# Patient Record
Sex: Female | Born: 1975 | Race: Black or African American | Hispanic: No | Smoking: Current every day smoker
Health system: Southern US, Community
[De-identification: ages and names within clinical notes are randomized; demographics above are authoritative.]

## PROBLEM LIST (undated history)

## (undated) DIAGNOSIS — J449 Chronic obstructive pulmonary disease, unspecified: Secondary | ICD-10-CM

## (undated) DIAGNOSIS — F419 Anxiety disorder, unspecified: Secondary | ICD-10-CM

## (undated) DIAGNOSIS — Z5189 Encounter for other specified aftercare: Secondary | ICD-10-CM

## (undated) DIAGNOSIS — F329 Major depressive disorder, single episode, unspecified: Secondary | ICD-10-CM

## (undated) DIAGNOSIS — D649 Anemia, unspecified: Secondary | ICD-10-CM

## (undated) DIAGNOSIS — F32A Depression, unspecified: Secondary | ICD-10-CM

## (undated) DIAGNOSIS — T7840XA Allergy, unspecified, initial encounter: Secondary | ICD-10-CM

## (undated) DIAGNOSIS — I2699 Other pulmonary embolism without acute cor pulmonale: Secondary | ICD-10-CM

## (undated) HISTORY — DX: Depression, unspecified: F32.A

## (undated) HISTORY — DX: Encounter for other specified aftercare: Z51.89

## (undated) HISTORY — DX: Major depressive disorder, single episode, unspecified: F32.9

## (undated) HISTORY — DX: Anxiety disorder, unspecified: F41.9

## (undated) HISTORY — PX: ABDOMINAL HYSTERECTOMY: SHX81

## (undated) HISTORY — PX: OTHER SURGICAL HISTORY: SHX169

## (undated) HISTORY — PX: HERNIA REPAIR: SHX51

## (undated) HISTORY — DX: Allergy, unspecified, initial encounter: T78.40XA

## (undated) HISTORY — DX: Anemia, unspecified: D64.9

## (undated) HISTORY — PX: TUBAL LIGATION: SHX77

---

## 2015-01-21 ENCOUNTER — Encounter (INDEPENDENT_AMBULATORY_CARE_PROVIDER_SITE_OTHER): Payer: Self-pay

## 2015-01-21 ENCOUNTER — Ambulatory Visit (INDEPENDENT_AMBULATORY_CARE_PROVIDER_SITE_OTHER): Payer: Medicaid Other | Admitting: Pediatrics

## 2015-01-21 ENCOUNTER — Encounter: Payer: Self-pay | Admitting: Pediatrics

## 2015-01-21 VITALS — BP 123/81 | HR 69 | Temp 97.1°F | Ht 66.0 in | Wt 119.4 lb

## 2015-01-21 DIAGNOSIS — F411 Generalized anxiety disorder: Secondary | ICD-10-CM

## 2015-01-21 MED ORDER — ALPRAZOLAM 0.25 MG PO TABS: 0.2500 mg | ORAL_TABLET | Freq: Two times a day (BID) | ORAL | Status: DC

## 2015-01-21 MED ORDER — BUSPIRONE HCL 7.5 MG PO TABS: 15.0000 mg | ORAL_TABLET | Freq: Two times a day (BID) | ORAL | Status: DC

## 2015-01-21 NOTE — Progress Notes (Signed)
Subjective:    Patient ID: Chloe Ortiz, female    DOB: 01/11/1976, 39 y.o.   MRN: [id number]  HPI: Chloe Ortiz is a 39 y.o. female presenting on 01/21/2015 for New Patient (Initial Visit)  Mom has been battling cancer for two years, Since Aug 4th mom's health has been going down hill, complications and multiple surgeries. She had a laryngectomy for throat cancer. Has started taking breaks from staying with mom this week.   Has been on meds for anxiety in the past. Has tried xanax in the past, has tried lorazepam. First started in her twenties. Last was a few years ago. Says she has been on multiple medicines in the past for depression such as paxil, zoloft, but doesn't think she has ever been depressed. Mood has been stable and fine, aside from family illness. No thoughts of hurting herself or anyone else.  Relevant past medical, surgical, family and social history reviewed and updated as indicated. Interim medical history since our last visit reviewed. Allergies and medications reviewed and updated.  Review of Systems  Constitutional: Negative.   HENT: Negative.   Respiratory: Negative.   Cardiovascular: Negative.   Gastrointestinal: Negative.   Genitourinary: Negative.   Musculoskeletal: Negative.   Skin: Negative.   Neurological: Negative.   Psychiatric/Behavioral: Negative for depression and suicidal ideas. The patient is nervous/anxious.     ROS: Per HPI unless specifically indicated above  GAD7 Feeling nervous, anxious or on edge 3  Not being able to stop or control worrying 3 Worrying too much about different things 3 Trouble relaxing 2 Being so restless that it is hard to sit still 0 Becoming easily annoyed or irritable 2 Feeling afraid as if something awful might happen 2        Total Score 15   Past Medical History There are no active problems to display for this patient.   Current Outpatient Prescriptions  Medication Sig Dispense Refill  . ALPRAZolam  (XANAX) 0.25 MG tablet Take 1 tablet (0.25 mg total) by mouth 2 (two) times daily. 30 tablet 0  . busPIRone (BUSPAR) 7.5 MG tablet Take 2 tablets (15 mg total) by mouth 2 (two) times daily. 60 tablet 2  . estradiol (ESTRACE) 1 MG tablet Take 0.5 mg by mouth daily.    . megestrol (MEGACE) 40 MG tablet   0   No current facility-administered medications for this visit.       Objective:    BP 123/81 mmHg  Pulse 69  Temp(Src) 97.1 F (36.2 C) (Oral)  Ht 5\' 6"  (1.676 m)  Wt 119 lb 6.4 oz (54.159 kg)  BMI 19.28 kg/m2  Wt Readings from Last 3 Encounters:  01/21/15 119 lb 6.4 oz (54.159 kg)     Gen: NAD, alert, thin, cooperative with exam, NCAT EYES: EOMI, no scleral injection or icterus ENT:  OP without erythema LYMPH: no cervical LAD NECK: normal thyroid, no nodules CV: NRRR, normal S1/S2, no murmur, DP pulses 2+ b/l Resp: CTABL, no wheezes, normal WOB Abd: +BS, soft, NTND. no guarding or organomegaly Ext: No edema, warm Neuro: Alert and oriented, strength equal b/l UE and LE, coodrination grossly normal MSK: normal muscle bulk     Assessment & Plan:   Chloe Ortiz was seen today for new patient (initial visit). She meets criteria for gen anxiety d/o based on GAD7, she has had symptoms since her twenties. Recently exacerbated by family illness and stressors. Xanax for acute stress over next month. If still having trouble with  sleep at next visit will try something else.  Diagnoses and all orders for this visit:  Generalized anxiety disorder -     busPIRone (BUSPAR) 7.5 MG tablet; Take 2 tablets (15 mg total) by mouth 2 (two) times daily. -     ALPRAZolam (XANAX) 0.25 MG tablet; Take 1 tablet (0.25 mg total) by mouth 2 (two) times daily.   Follow up plan: Return in about 4 weeks (around 02/18/2015).  Chloe Kras, MD [facility] 01/21/2015, 3:18 PM

## 2015-01-21 NOTE — Patient Instructions (Addendum)
One tab in the morning, one tab at night.  Can increase after 4-5 days to 1.5 tabs in morning.  Then after a few days 1.5 tabs in the morning and at night.  Max increase to 2 tabs in the morning, 2 tabs at night.

## 2015-02-02 ENCOUNTER — Telehealth: Payer: Self-pay | Admitting: Pediatrics

## 2015-02-02 NOTE — Telephone Encounter (Signed)
Patient aware that Dr. Oswaldo Done will not increase or change amount.

## 2015-02-18 ENCOUNTER — Ambulatory Visit: Payer: Medicaid Other | Admitting: Pediatrics

## 2015-02-19 ENCOUNTER — Encounter: Payer: Self-pay | Admitting: Pediatrics

## 2015-04-27 ENCOUNTER — Ambulatory Visit: Payer: Medicaid Other

## 2019-06-23 ENCOUNTER — Institutional Professional Consult (permissible substitution): Payer: Self-pay | Admitting: Pulmonary Disease

## 2019-07-23 ENCOUNTER — Other Ambulatory Visit: Payer: Self-pay

## 2019-07-23 ENCOUNTER — Encounter: Payer: Self-pay | Admitting: Pulmonary Disease

## 2019-07-23 ENCOUNTER — Ambulatory Visit (INDEPENDENT_AMBULATORY_CARE_PROVIDER_SITE_OTHER): Payer: Medicaid Other | Admitting: Pulmonary Disease

## 2019-07-23 VITALS — BP 100/70 | HR 66 | Temp 98.4°F | Ht 65.5 in | Wt 120.2 lb

## 2019-07-23 DIAGNOSIS — Z79890 Hormone replacement therapy (postmenopausal): Secondary | ICD-10-CM

## 2019-07-23 DIAGNOSIS — J4540 Moderate persistent asthma, uncomplicated: Secondary | ICD-10-CM | POA: Diagnosis not present

## 2019-07-23 DIAGNOSIS — R0602 Shortness of breath: Secondary | ICD-10-CM

## 2019-07-23 DIAGNOSIS — F17200 Nicotine dependence, unspecified, uncomplicated: Secondary | ICD-10-CM

## 2019-07-23 DIAGNOSIS — I2699 Other pulmonary embolism without acute cor pulmonale: Secondary | ICD-10-CM | POA: Diagnosis not present

## 2019-07-23 MED ORDER — TRELEGY ELLIPTA 200-62.5-25 MCG/INH IN AEPB: 1.0000 | INHALATION_SPRAY | Freq: Every day | RESPIRATORY_TRACT | 0 refills | Status: AC

## 2019-07-23 MED ORDER — ALBUTEROL SULFATE (2.5 MG/3ML) 0.083% IN NEBU: 2.5000 mg | INHALATION_SOLUTION | Freq: Four times a day (QID) | RESPIRATORY_TRACT | 11 refills | Status: DC | PRN

## 2019-07-23 NOTE — Addendum Note (Signed)
Addended by: Jacquiline Doe on: 07/23/2019 02:42 PM   Modules accepted: Orders

## 2019-07-23 NOTE — Progress Notes (Signed)
Synopsis: Referred in March 2021 for asthma/copd by Kizzie Furnish D., PA-C  Subjective:   PATIENT ID: Chloe Ortiz GENDER: female DOB: 1976/01/23, MRN: [id number]  Chief Complaint  Patient presents with  . Consult    Patient was at [facility] and scans showed blood clots. Patient has shortness of breath with exertion. Patient was started on Eliquis twice a day. Patient has been diagnosed with asthma and COPD.     44 year old female past medical history of allergies, asthma since childhood longstanding history of smoking since teenager.  Quit in January 2021 at the time of her recent ER visit.  Patient went to the emergency room at [facility] care with complaints of shortness of breath.  Patient was evaluated for Covid.  Covid was negative she underwent CT imaging of the chest after having a positive D-dimer of greater than 2.  She was found to have bilateral PE.  Patient was started on Eliquis.  Patient's medication regimen includes Megace given to her for an appetite stimulant as well as estradiol.  She is also at the time a smoker.  She is not up-to-date with her mammogram screening and she is above the age of 41.  She did have a hysterectomy in the past.  She states that she did have abnormal Pap smears in the past but has not followed up with women health in several years.  Overall today she complains of still fatigue and shortness of breath.  Denies hemoptysis.      Past Medical History:  Diagnosis Date  . Allergy   . Anemia   . Anxiety   . Blood transfusion without reported diagnosis   . Depression      Family History  Problem Relation Age of Onset  . Cancer Mother   . Alcohol abuse Mother   . Arthritis Mother   . Diabetes Mother   . Drug abuse Mother   . Hypertension Mother   . Miscarriages / [place] Mother   . Varicose Veins Mother   . Alcohol abuse Father   . Asthma Father   . Depression Father   . Diabetes Father   . Drug abuse Father    . Hyperlipidemia Father   . Hypertension Father   . Mental illness Father   . Varicose Veins Father   . Diabetes Sister   . Hypertension Sister   . Varicose Veins Sister      Past Surgical History:  Procedure Laterality Date  . ABDOMINAL HYSTERECTOMY    . HERNIA REPAIR    . Lypoma removal    . TUBAL LIGATION      Social History   Socioeconomic History  . Marital status: Divorced    Spouse name: Not on file  . Number of children: Not on file  . Years of education: Not on file  . Highest education level: Not on file  Occupational History  . Not on file  Tobacco Use  . Smoking status: Current Some Day Smoker    Packs/day: 0.25    Years: 10.00    Pack years: 2.50    Types: Cigars, Cigarettes  . Smokeless tobacco: Never Used  Substance and Sexual Activity  . Alcohol use: No  . Drug use: Yes    Types: Marijuana  . Sexual activity: Never  Other Topics Concern  . Not on file  Social History Narrative  . Not on file   Social Determinants of Health   Financial Resource Strain:   . Difficulty  of Paying Living Expenses: Not on file  Food Insecurity:   . Worried About Programme researcher, broadcasting/film/video in the Last Year: Not on file  . Ran Out of Food in the Last Year: Not on file  Transportation Needs:   . Lack of Transportation (Medical): Not on file  . Lack of Transportation (Non-Medical): Not on file  Physical Activity:   . Days of Exercise per Week: Not on file  . Minutes of Exercise per Session: Not on file  Stress:   . Feeling of Stress : Not on file  Social Connections:   . Frequency of Communication with Friends and Family: Not on file  . Frequency of Social Gatherings with Friends and Family: Not on file  . Attends Religious Services: Not on file  . Active Member of Clubs or Organizations: Not on file  . Attends Banker Meetings: Not on file  . Marital Status: Not on file  Intimate Partner Violence:   . Fear of Current or Ex-Partner: Not on file  .  Emotionally Abused: Not on file  . Physically Abused: Not on file  . Sexually Abused: Not on file     Not on File   Outpatient Medications Prior to Visit  Medication Sig Dispense Refill  . albuterol (VENTOLIN HFA) 108 (90 Base) MCG/ACT inhaler Inhale 1 puff into the lungs as needed.    Marland Kitchen apixaban (ELIQUIS) 5 MG TABS tablet Take 5 mg by mouth 2 (two) times daily.    . megestrol (MEGACE) 40 MG tablet   0  . ALPRAZolam (XANAX) 0.25 MG tablet Take 1 tablet (0.25 mg total) by mouth 2 (two) times daily. (Patient not taking: Reported on 07/23/2019) 30 tablet 0  . busPIRone (BUSPAR) 7.5 MG tablet Take 2 tablets (15 mg total) by mouth 2 (two) times daily. (Patient not taking: Reported on 07/23/2019) 60 tablet 2  . estradiol (ESTRACE) 1 MG tablet Take 0.5 mg by mouth daily.     No facility-administered medications prior to visit.    Review of Systems  Constitutional: Negative for chills, fever, malaise/fatigue and weight loss.  HENT: Negative for hearing loss, sore throat and tinnitus.   Eyes: Negative for blurred vision and double vision.  Respiratory: Positive for shortness of breath. Negative for cough, hemoptysis, sputum production, wheezing and stridor.   Cardiovascular: Negative for chest pain, palpitations, orthopnea, leg swelling and PND.  Gastrointestinal: Negative for abdominal pain, constipation, diarrhea, heartburn, nausea and vomiting.  Genitourinary: Negative for dysuria, hematuria and urgency.  Musculoskeletal: Negative for joint pain and myalgias.  Skin: Negative for itching and rash.  Neurological: Negative for dizziness, tingling, weakness and headaches.  Endo/Heme/Allergies: Negative for environmental allergies. Does not bruise/bleed easily.  Psychiatric/Behavioral: Negative for depression. The patient is not nervous/anxious and does not have insomnia.   All other systems reviewed and are negative.    Objective:  Physical Exam Vitals reviewed.  Constitutional:       General: She is not in acute distress.    Appearance: She is well-developed.     Comments: Thin    HENT:     Head: Normocephalic and atraumatic.  Eyes:     General: No scleral icterus.    Conjunctiva/sclera: Conjunctivae normal.     Pupils: Pupils are equal, round, and reactive to light.  Neck:     Vascular: No JVD.     Trachea: No tracheal deviation.  Cardiovascular:     Rate and Rhythm: Normal rate and regular rhythm.  Heart sounds: Normal heart sounds. No murmur.  Pulmonary:     Effort: Pulmonary effort is normal. No tachypnea, accessory muscle usage or respiratory distress.     Breath sounds: Normal breath sounds. No stridor. No wheezing, rhonchi or rales.  Abdominal:     General: Bowel sounds are normal. There is no distension.     Palpations: Abdomen is soft.     Tenderness: There is no abdominal tenderness.  Musculoskeletal:        General: No tenderness.     Cervical back: Neck supple.     Comments: Low muscle mass  Lymphadenopathy:     Cervical: No cervical adenopathy.  Skin:    General: Skin is warm and dry.     Capillary Refill: Capillary refill takes less than 2 seconds.     Findings: No rash.  Neurological:     Mental Status: She is alert and oriented to person, place, and time.  Psychiatric:        Behavior: Behavior normal.      Vitals:   07/23/19 1351  BP: 100/70  Pulse: 66  Temp: 98.4 F (36.9 C)  TempSrc: Temporal  SpO2: 99%  Weight: 120 lb 3.2 oz (54.5 kg)  Height: 5' 5.5" (1.664 m)   99% on RA BMI Readings from Last 3 Encounters:  07/23/19 19.70 kg/m  01/21/15 19.27 kg/m   Wt Readings from Last 3 Encounters:  07/23/19 120 lb 3.2 oz (54.5 kg)  01/21/15 119 lb 6.4 oz (54.2 kg)     CBC No results found for: WBC, RBC, HGB, HCT, PLT, MCV, MCH, MCHC, RDW, LYMPHSABS, MONOABS, EOSABS, BASOSABS   Chest Imaging: I was able to review report but not images themself.  Bilateral PE from CT chest 05/23/2019 [facility].  Pulmonary  Functions Testing Results: No flowsheet data found.  FeNO: None   Pathology: None   Echocardiogram: None   Heart Catheterization: None     Assessment & Plan:     ICD-10-CM   1. SOB (shortness of breath)  R06.02 Pulmonary Function Test  2. Bilateral pulmonary embolism (HCC)  I26.99   3. Moderate persistent asthma without complication  J45.40   4. Smoker  F17.200   5. Hormone replacement therapy  Z79.890     Discussion: This is a young 44 year old female likely with provoked pulmonary embolism in the setting of Megace use, estradiol use and smoking.  I believe this will continue to take some time for recovery of her shortness of breath.  However her asthma symptoms are not controlled.  She does have nocturnal wheezing.  Thankfully she has been able to quit smoking.  She has at least smoked 1 or 2 cigarettes since January but she does plan to completely quit.  Plan: DME supplies for albuterol nebulizer Samples of Trelegy 200 New albuterol inhalers prescription. Stop Megace use Stop estradiol use Follow-up with women's health for age-appropriate cancer screening Likely needs mammogram, has had hysterectomy question need of cervical cancer screening continued. Continue Eliquis at this time.  Follow-up in 3 months with pulmonary function test.  All have been ordered. Patient instructed on how to use new inhaler.  Greater than 50% of this patient's 45-minute of visit was spent face-to-face discussing the above recommendations and treatment plan.     Current Outpatient Medications:  .  albuterol (VENTOLIN HFA) 108 (90 Base) MCG/ACT inhaler, Inhale 1 puff into the lungs as needed., Disp: , Rfl:  .  apixaban (ELIQUIS) 5 MG TABS tablet, Take 5  mg by mouth 2 (two) times daily., Disp: , Rfl:  .  megestrol (MEGACE) 40 MG tablet, , Disp: , Rfl: 0 .  ALPRAZolam (XANAX) 0.25 MG tablet, Take 1 tablet (0.25 mg total) by mouth 2 (two) times daily. (Patient not taking: Reported on 07/23/2019),  Disp: 30 tablet, Rfl: 0 .  busPIRone (BUSPAR) 7.5 MG tablet, Take 2 tablets (15 mg total) by mouth 2 (two) times daily. (Patient not taking: Reported on 07/23/2019), Disp: 60 tablet, Rfl: 2 .  estradiol (ESTRACE) 1 MG tablet, Take 0.5 mg by mouth daily., Disp: , Rfl:    Garner Nash, DO [facility] 07/23/2019 2:02 PM

## 2019-07-23 NOTE — Patient Instructions (Addendum)
Thank you for visiting Dr. Tonia Brooms at [facility]. Today we recommend the following:  Orders Placed This Encounter  Procedures  . Pulmonary Function Test   DME supply for new nebulizer machine + albuterol solution  New rx for albuterol inhaler Samples of Trelegy 200 Stop megace and estradiol  Stop smoking Continue eliquis until we get this info back and 3 months off offending agents  Follow up womens health, ?repeat PAP needs also need mammogram   Return in about 3 months (around 10/23/2019). w/ Dr. Tonia Brooms or APP     Please do your part to reduce the spread of COVID-19.

## 2019-07-29 ENCOUNTER — Telehealth: Payer: Self-pay | Admitting: Pulmonary Disease

## 2019-07-29 NOTE — Telephone Encounter (Signed)
Order for nebulizer was placed 3/3 after pt's OV with Dr. Tonia Brooms.  [facility], can you help [place] out with this please as pt is checking on status of the machine and supplies.

## 2019-07-30 NOTE — Telephone Encounter (Signed)
[facility]ed [facility] they had pt set up to get machine but she never came and got it I gave them her # and asked them to call I will also call the [name]

## 2019-10-17 ENCOUNTER — Other Ambulatory Visit (HOSPITAL_COMMUNITY)
Admission: RE | Admit: 2019-10-17 | Discharge: 2019-10-17 | Disposition: A | Discharge status: Home / Self Care | Payer: Medicaid Other | Source: Ambulatory Visit | Attending: Pulmonary Disease | Admitting: Pulmonary Disease

## 2019-10-21 ENCOUNTER — Other Ambulatory Visit (HOSPITAL_COMMUNITY)
Admission: RE | Admit: 2019-10-21 | Discharge: 2019-10-21 | Disposition: A | Discharge status: Home / Self Care | Payer: Medicaid Other | Source: Ambulatory Visit | Attending: Pulmonary Disease | Admitting: Pulmonary Disease

## 2019-10-21 NOTE — Progress Notes (Signed)
Patient missed COVID test Fri. 10/17/2019. (I forgot to make a note then.)  I rescheded her test for this morning pt didn't show. Calling Almyra Free to ask her to call patient to reschedule COVID test and Procedure. Spoke with Chantel, she said she would call the patient to set up COVID test and procedure again. Nothing further needed.

## 2019-10-23 ENCOUNTER — Ambulatory Visit: Payer: Medicaid Other | Admitting: Pulmonary Disease

## 2019-10-23 ENCOUNTER — Ambulatory Visit: Payer: Medicaid Other | Admitting: Primary Care

## 2020-01-16 ENCOUNTER — Emergency Department (HOSPITAL_COMMUNITY)
Admission: EM | Admit: 2020-01-16 | Discharge: 2020-01-16 | Disposition: A | Discharge status: Home / Self Care | Payer: Medicaid Other | Attending: Emergency Medicine | Admitting: Emergency Medicine

## 2020-01-16 ENCOUNTER — Encounter (HOSPITAL_COMMUNITY): Payer: Self-pay

## 2020-01-16 ENCOUNTER — Other Ambulatory Visit: Payer: Self-pay

## 2020-01-16 ENCOUNTER — Emergency Department (HOSPITAL_COMMUNITY): Payer: Medicaid Other

## 2020-01-16 DIAGNOSIS — M545 Low back pain: Secondary | ICD-10-CM | POA: Insufficient documentation

## 2020-01-16 DIAGNOSIS — Z86711 Personal history of pulmonary embolism: Secondary | ICD-10-CM | POA: Insufficient documentation

## 2020-01-16 DIAGNOSIS — R0789 Other chest pain: Secondary | ICD-10-CM | POA: Insufficient documentation

## 2020-01-16 DIAGNOSIS — Z5321 Procedure and treatment not carried out due to patient leaving prior to being seen by health care provider: Secondary | ICD-10-CM | POA: Diagnosis not present

## 2020-01-16 HISTORY — DX: Other pulmonary embolism without acute cor pulmonale: I26.99

## 2020-01-16 LAB — TROPONIN I (HIGH SENSITIVITY)
Troponin I (High Sensitivity): 2 ng/L (ref <18)
Troponin I (High Sensitivity): 3 ng/L (ref <18)

## 2020-01-16 LAB — CBC
HCT: 43.3 % (ref 36.0–46.0)
Hemoglobin: 14.8 g/dL (ref 12.0–15.0)
MCH: 31.1 pg (ref 26.0–34.0)
MCHC: 34.2 g/dL (ref 30.0–36.0)
MCV: 91 fL (ref 80.0–100.0)
Platelets: 264 10*3/uL (ref 150–400)
RBC: 4.76 MIL/uL (ref 3.87–5.11)
RDW: 12.9 % (ref 11.5–15.5)
WBC: 7.6 10*3/uL (ref 4.0–10.5)
nRBC: 0 % (ref 0.0–0.2)

## 2020-01-16 LAB — BASIC METABOLIC PANEL
Anion gap: 15 (ref 5–15)
BUN: 15 mg/dL (ref 6–20)
CO2: 20 mmol/L — ABNORMAL LOW (ref 22–32)
Calcium: 9.6 mg/dL (ref 8.9–10.3)
Chloride: 100 mmol/L (ref 98–111)
Creatinine, Ser: 0.69 mg/dL (ref 0.44–1.00)
GFR calc Af Amer: >60 mL/min (ref >60)
GFR calc non Af Amer: >60 mL/min (ref >60)
Glucose, Bld: 72 mg/dL (ref 70–99)
Potassium: 3.6 mmol/L (ref 3.5–5.1)
Sodium: 135 mmol/L (ref 135–145)

## 2020-01-16 LAB — D-DIMER, QUANTITATIVE: D-Dimer, Quant: <0.27 ug/mL-FEU (ref 0.00–0.50)

## 2020-01-16 NOTE — ED Triage Notes (Signed)
Pt reports that she has pain left chest and right lower back. Not eating well and vomiting. Pt has a hx of PE back in [place]

## 2020-03-14 ENCOUNTER — Emergency Department (HOSPITAL_COMMUNITY)
Admission: EM | Admit: 2020-03-14 | Discharge: 2020-03-14 | Disposition: A | Discharge status: Home / Self Care | Payer: Medicaid Other | Attending: Emergency Medicine | Admitting: Emergency Medicine

## 2020-03-14 ENCOUNTER — Emergency Department (HOSPITAL_COMMUNITY): Payer: Medicaid Other

## 2020-03-14 ENCOUNTER — Encounter (HOSPITAL_COMMUNITY): Payer: Self-pay | Admitting: *Deleted

## 2020-03-14 DIAGNOSIS — J449 Chronic obstructive pulmonary disease, unspecified: Secondary | ICD-10-CM | POA: Diagnosis not present

## 2020-03-14 DIAGNOSIS — J069 Acute upper respiratory infection, unspecified: Secondary | ICD-10-CM | POA: Insufficient documentation

## 2020-03-14 DIAGNOSIS — F17210 Nicotine dependence, cigarettes, uncomplicated: Secondary | ICD-10-CM | POA: Diagnosis not present

## 2020-03-14 DIAGNOSIS — Z20822 Contact with and (suspected) exposure to covid-19: Secondary | ICD-10-CM | POA: Diagnosis not present

## 2020-03-14 DIAGNOSIS — R0602 Shortness of breath: Secondary | ICD-10-CM

## 2020-03-14 DIAGNOSIS — R042 Hemoptysis: Secondary | ICD-10-CM | POA: Diagnosis present

## 2020-03-14 HISTORY — DX: Chronic obstructive pulmonary disease, unspecified: J44.9

## 2020-03-14 LAB — TROPONIN I (HIGH SENSITIVITY): Troponin I (High Sensitivity): <2 ng/L (ref <18)

## 2020-03-14 LAB — BASIC METABOLIC PANEL
Anion gap: 8 (ref 5–15)
BUN: 11 mg/dL (ref 6–20)
CO2: 25 mmol/L (ref 22–32)
Calcium: 9 mg/dL (ref 8.9–10.3)
Chloride: 105 mmol/L (ref 98–111)
Creatinine, Ser: 0.58 mg/dL (ref 0.44–1.00)
GFR, Estimated: >60 mL/min (ref >60)
Glucose, Bld: 91 mg/dL (ref 70–99)
Potassium: 3.9 mmol/L (ref 3.5–5.1)
Sodium: 138 mmol/L (ref 135–145)

## 2020-03-14 LAB — CBC WITH DIFFERENTIAL/PLATELET
Abs Immature Granulocytes: 0.03 10*3/uL (ref 0.00–0.07)
Basophils Absolute: 0 10*3/uL (ref 0.0–0.1)
Basophils Relative: 0 %
Eosinophils Absolute: 0.2 10*3/uL (ref 0.0–0.5)
Eosinophils Relative: 2 %
HCT: 38.8 % (ref 36.0–46.0)
Hemoglobin: 12.8 g/dL (ref 12.0–15.0)
Immature Granulocytes: 0 %
Lymphocytes Relative: 19 %
Lymphs Abs: 1.9 10*3/uL (ref 0.7–4.0)
MCH: 30.3 pg (ref 26.0–34.0)
MCHC: 33 g/dL (ref 30.0–36.0)
MCV: 91.9 fL (ref 80.0–100.0)
Monocytes Absolute: 0.5 10*3/uL (ref 0.1–1.0)
Monocytes Relative: 5 %
Neutro Abs: 7.1 10*3/uL (ref 1.7–7.7)
Neutrophils Relative %: 74 %
Platelets: 234 10*3/uL (ref 150–400)
RBC: 4.22 MIL/uL (ref 3.87–5.11)
RDW: 13.2 % (ref 11.5–15.5)
WBC: 9.7 10*3/uL (ref 4.0–10.5)
nRBC: 0 % (ref 0.0–0.2)

## 2020-03-14 LAB — RESPIRATORY PANEL BY RT PCR (FLU A&B, COVID)
Influenza A by PCR: NEGATIVE
Influenza B by PCR: NEGATIVE
SARS Coronavirus 2 by RT PCR: NEGATIVE

## 2020-03-14 MED ORDER — BENZONATATE 100 MG PO CAPS: 200.0000 mg | ORAL_CAPSULE | Freq: Three times a day (TID) | ORAL | 0 refills | Status: DC | PRN

## 2020-03-14 MED ORDER — ALBUTEROL SULFATE (2.5 MG/3ML) 0.083% IN NEBU: 5.0000 mg | INHALATION_SOLUTION | Freq: Once | RESPIRATORY_TRACT | Status: DC

## 2020-03-14 MED ORDER — IOHEXOL 350 MG/ML SOLN
75.0000 mL | Freq: Once | INTRAVENOUS | Status: AC | PRN
  Administered 2020-03-14: 75 mL via INTRAVENOUS

## 2020-03-14 NOTE — ED Triage Notes (Signed)
Cough, vomiting, diminished smell and taste

## 2020-03-14 NOTE — ED Provider Notes (Signed)
[facility] EMERGENCY DEPARTMENT Provider Note   CSN: [id number] Arrival date & time: 03/14/20  1035     History Chief Complaint  Patient presents with  . Hemoptysis    Chloe Ortiz is a 44 y.o. female with a history significant for anemia, COPD, history of pulmonary embolism diagnosed at [facility] who completed a 6 month course of xaralto 2 months ago, presenting with a 1 day history of cough with hemoptysis along with nasal congestion, reduced smell and taste, and reports woke today with a nose bleed which resolved spontaneously.  She describes coughing up blood tinged sputum along with what appears to be a solid blood clot (has picture on phone) this morning.  Of note, this occurred after she had the nose bleed.  She does report chills and flushes but no documented fevers. She denies chest pain, n/v, abdominal pain and denies lower extremity pain or swelling.  She has not been covid vaccinated, states they initially though she had Covid when admitted at [facility] when she was diagnosed with the PE, tested negative for Covid.   The history is provided by the patient.       Past Medical History:  Diagnosis Date  . Allergy   . Anemia   . Anxiety   . Blood transfusion without reported diagnosis   . COPD (chronic obstructive pulmonary disease) (HCC)   . Depression   . Pulmonary embolism (HCC)     There are no problems to display for this patient.   Past Surgical History:  Procedure Laterality Date  . ABDOMINAL HYSTERECTOMY    . HERNIA REPAIR    . Lypoma removal    . TUBAL LIGATION       OB History   No obstetric history on file.     Family History  Problem Relation Age of Onset  . Cancer Mother   . Alcohol abuse Mother   . Arthritis Mother   . Diabetes Mother   . Drug abuse Mother   . Hypertension Mother   . Miscarriages / [place] Mother   . Varicose Veins Mother   . Alcohol abuse Father   . Asthma Father   . Depression Father   . Diabetes Father   .  Drug abuse Father   . Hyperlipidemia Father   . Hypertension Father   . Mental illness Father   . Varicose Veins Father   . Diabetes Sister   . Hypertension Sister   . Varicose Veins Sister     Social History   Tobacco Use  . Smoking status: Current Some Day Smoker    Packs/day: 0.25    Years: 10.00    Pack years: 2.50    Types: Cigars, Cigarettes  . Smokeless tobacco: Never Used  Vaping Use  . Vaping Use: Never used  Substance Use Topics  . Alcohol use: No  . Drug use: Yes    Types: Marijuana    Home Medications Prior to Admission medications   Medication Sig Start Date End Date Taking? Authorizing Provider  albuterol (PROVENTIL) (2.5 MG/3ML) 0.083% nebulizer solution Take 3 mLs (2.5 mg total) by nebulization every 6 (six) hours as needed for wheezing or shortness of breath. 07/23/19   Icard, Rachel Bo, DO  albuterol (VENTOLIN HFA) 108 (90 Base) MCG/ACT inhaler Inhale 1 puff into the lungs as needed. 11/04/18 11/04/19  [provider]  ALPRAZolam Prudy Feeler) 0.25 MG tablet Take 1 tablet (0.25 mg total) by mouth 2 (two) times daily. Patient not taking: Reported on  07/23/2019 01/21/15   Johna Sheriff, MD  apixaban (ELIQUIS) 5 MG TABS tablet Take 5 mg by mouth 2 (two) times daily.    [provider]  benzonatate (TESSALON) 100 MG capsule Take 2 capsules (200 mg total) by mouth 3 (three) times daily as needed. 03/14/20   Burgess Amor, PA-C  busPIRone (BUSPAR) 7.5 MG tablet Take 2 tablets (15 mg total) by mouth 2 (two) times daily. Patient not taking: Reported on 07/23/2019 01/21/15   Johna Sheriff, MD  estradiol (ESTRACE) 1 MG tablet Take 0.5 mg by mouth daily.    [provider]  Fluticasone-Umeclidin-Vilant (TRELEGY ELLIPTA) 200-62.5-25 MCG/INH AEPB Inhale 1 puff into the lungs daily. 07/23/19   Josephine Igo, DO  megestrol (MEGACE) 40 MG tablet  01/19/15   [provider]    Allergies    Patient has no known allergies.  Review of Systems    Review of Systems  Constitutional: Positive for chills and diaphoresis. Negative for fever.  HENT: Positive for congestion and nosebleeds. Negative for sore throat.   Eyes: Negative.   Respiratory: Positive for cough and shortness of breath. Negative for chest tightness and wheezing.   Cardiovascular: Negative for chest pain, palpitations and leg swelling.  Gastrointestinal: Negative for abdominal pain, nausea and vomiting.  Genitourinary: Negative.   Musculoskeletal: Negative for arthralgias, joint swelling and neck pain.  Skin: Negative.  Negative for rash and wound.  Neurological: Negative for dizziness, weakness, light-headedness, numbness and headaches.  Psychiatric/Behavioral: Negative.   All other systems reviewed and are negative.   Physical Exam Updated Vital Signs BP (!) 139/91 (BP Location: Right Arm)   Pulse 60   Temp 98 F (36.7 C) (Oral)   Resp 16   SpO2 100%   Physical Exam Constitutional:      Appearance: She is well-developed.  HENT:     Head: Normocephalic and atraumatic.     Right Ear: Tympanic membrane and ear canal normal.     Left Ear: Tympanic membrane and ear canal normal.     Nose: Mucosal edema and congestion present. No rhinorrhea.     Comments: No active bleeding or source of bleed seen.    Mouth/Throat:     Mouth: Mucous membranes are moist.     Pharynx: Uvula midline. No oropharyngeal exudate or posterior oropharyngeal erythema.     Tonsils: No tonsillar abscesses.  Eyes:     Conjunctiva/sclera: Conjunctivae normal.  Cardiovascular:     Rate and Rhythm: Normal rate.     Heart sounds: Normal heart sounds.  Pulmonary:     Effort: Pulmonary effort is normal. No respiratory distress.     Breath sounds: No wheezing, rhonchi or rales.  Abdominal:     Palpations: Abdomen is soft.     Tenderness: There is no abdominal tenderness. There is no guarding or rebound.  Musculoskeletal:        General: No tenderness. Normal range of motion.      Cervical back: Normal range of motion.     Right lower leg: No edema.     Left lower leg: No edema.  Skin:    General: Skin is warm and dry.     Findings: No rash.  Neurological:     Mental Status: She is alert and oriented to person, place, and time.     ED Results / Procedures / Treatments   Labs (all labs ordered are listed, but only abnormal results are displayed) Labs Reviewed  RESPIRATORY PANEL BY  RT PCR (FLU A&B, COVID)  CBC WITH DIFFERENTIAL/PLATELET  BASIC METABOLIC PANEL  TROPONIN I (HIGH SENSITIVITY)    EKG None  Radiology CT Angio Chest PE W and/or Wo Contrast  Result Date: 03/14/2020 CLINICAL DATA:  Left chest pain, cough x3 days, history of PE, high probability PE EXAM: CT ANGIOGRAPHY CHEST WITH CONTRAST TECHNIQUE: Multidetector CT imaging of the chest was performed using the standard protocol during bolus administration of intravenous contrast. Multiplanar CT image reconstructions and MIPs were obtained to evaluate the vascular anatomy. CONTRAST:  29mL OMNIPAQUE IOHEXOL 350 MG/ML SOLN COMPARISON:  10/06/2019 FINDINGS: Cardiovascular: Heart size upper limits normal. No pericardial effusion. Some reflux of contrast from the right atrium into hepatic veins. Satisfactory opacification of pulmonary arteries noted, and there is no evidence of pulmonary emboli. Fair contrast opacification of the thoracic aorta. No suggestion of aneurysm, dissection, or stenosis. 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 Mediastinum/Nodes: No mass or adenopathy. Lungs/Pleura: No pleural effusion. No pneumothorax. Small subpleural blebs in the apices. Lungs otherwise clear. Upper Abdomen: No acute findings. Musculoskeletal: No chest wall abnormality. No acute or significant osseous findings. Review of the MIP images confirms the above findings. IMPRESSION: Negative for acute PE or thoracic aortic dissection. Electronically Signed   By: Corlis Leak M.D.   On:  03/14/2020 14:31   DG Chest Port 1 View  Result Date: 03/14/2020 CLINICAL DATA:  Nausea and vomiting EXAM: PORTABLE CHEST 1 VIEW COMPARISON:  01/16/2020 FINDINGS: The heart size and mediastinal contours are within normal limits. Mildly hyperinflated lungs. Both lungs are clear. The visualized skeletal structures are unremarkable. IMPRESSION: No acute cardiopulmonary process. Electronically Signed   By: Duanne Guess D.O.   On: 03/14/2020 12:48    Procedures Procedures (including critical care time)  Medications Ordered in ED Medications  iohexol (OMNIPAQUE) 350 MG/ML injection 75 mL (75 mLs Intravenous Contrast Given 03/14/20 1405)    ED Course  I have reviewed the triage vital signs and the nursing notes.  Pertinent labs & imaging results that were available during my care of the patient were reviewed by me and considered in my medical decision making (see chart for details).    MDM Rules/Calculators/A&P                          Labs reviewed, CT imaging also reviewed and discussed with patient. He was given reassurance that she does not have a pulmonary embolism and also no sign of pneumonia. At reexam she was comfortable with her breathing, no wheezing, no shortness of breath and her vital signs were stable. She does continue to have nasal congestion without bleeding. Suspect she has a viral URI. Discussed home treatment for symptom relief, she was prescribed Tessalon for cough suppression. I suspect the hemoptysis was actually a sputum from postnasal drip from her earlier nosebleed. Advised as needed follow-up for any persistent or worsening symptoms. Her Covid test is negative today. Final Clinical Impression(s) / ED Diagnoses Final diagnoses:  Viral URI with cough    Rx / DC Orders ED Discharge Orders         Ordered    benzonatate (TESSALON) 100 MG capsule  3 times daily PRN        03/14/20 1558           Victoriano Lain 03/14/20 1921    Eber Hong,  MD 03/15/20 [phone]

## 2020-03-14 NOTE — Discharge Instructions (Signed)
Your cough may benefit from taking the Tessalon which has been prescribed to you.  I also encourage you to take Mucinex and make sure you are drinking plenty of water.  Get rest, you may also take Tylenol or Motrin for body aches.  You were CT imaging today is negative for pneumonia and pulmonary embolism.  Also your Covid and flu tests are negative.  Get rechecked for any persistent or worsening symptoms.

## 2020-06-02 DIAGNOSIS — Z0289 Encounter for other administrative examinations: Secondary | ICD-10-CM

## 2020-06-11 ENCOUNTER — Encounter (HOSPITAL_COMMUNITY): Payer: Self-pay | Admitting: Emergency Medicine

## 2020-06-11 ENCOUNTER — Other Ambulatory Visit: Payer: Self-pay

## 2020-06-11 ENCOUNTER — Emergency Department (HOSPITAL_COMMUNITY)
Admission: EM | Admit: 2020-06-11 | Discharge: 2020-06-11 | Disposition: A | Discharge status: Home / Self Care | Payer: Medicaid Other | Attending: Emergency Medicine | Admitting: Emergency Medicine

## 2020-06-11 DIAGNOSIS — Z20822 Contact with and (suspected) exposure to covid-19: Secondary | ICD-10-CM

## 2020-06-11 DIAGNOSIS — Z7951 Long term (current) use of inhaled steroids: Secondary | ICD-10-CM | POA: Insufficient documentation

## 2020-06-11 DIAGNOSIS — Z7901 Long term (current) use of anticoagulants: Secondary | ICD-10-CM | POA: Insufficient documentation

## 2020-06-11 DIAGNOSIS — F17290 Nicotine dependence, other tobacco product, uncomplicated: Secondary | ICD-10-CM | POA: Diagnosis not present

## 2020-06-11 DIAGNOSIS — U071 COVID-19: Secondary | ICD-10-CM | POA: Diagnosis not present

## 2020-06-11 DIAGNOSIS — J449 Chronic obstructive pulmonary disease, unspecified: Secondary | ICD-10-CM | POA: Diagnosis not present

## 2020-06-11 DIAGNOSIS — J111 Influenza due to unidentified influenza virus with other respiratory manifestations: Secondary | ICD-10-CM

## 2020-06-11 DIAGNOSIS — R519 Headache, unspecified: Secondary | ICD-10-CM | POA: Diagnosis present

## 2020-06-11 LAB — SARS CORONAVIRUS 2 (TAT 6-24 HRS): SARS Coronavirus 2: POSITIVE — AB

## 2020-06-11 MED ORDER — ONDANSETRON 4 MG PO TBDP
4.0000 mg | ORAL_TABLET | Freq: Once | ORAL | Status: AC
  Administered 2020-06-11: 4 mg via ORAL
  Filled 2020-06-11: qty 1

## 2020-06-11 MED ORDER — ONDANSETRON 4 MG PO TBDP: 4.0000 mg | ORAL_TABLET | Freq: Three times a day (TID) | ORAL | 0 refills | Status: DC | PRN

## 2020-06-11 MED ORDER — METHYLPREDNISOLONE 4 MG PO TBPK: ORAL_TABLET | ORAL | 0 refills | Status: DC

## 2020-06-11 NOTE — ED Triage Notes (Signed)
Pt was exposed to Covid by her children on Friday of last week.  Pt c/o a HA, body aches, fever, with N/V that began Wednesday.

## 2020-06-11 NOTE — ED Provider Notes (Signed)
[facility] EMERGENCY DEPARTMENT Provider Note   CSN: [id number] Arrival date & time: 06/11/20  1130     History Chief Complaint  Patient presents with  . Headache    Chloe Ortiz is a 45 y.o. female presents emergency department with chief complaint of flulike symptoms.  She had onset of her symptoms 3 days ago.  She has known COVID-19 virus exposure at home as both her children were infected last week.  She has symptoms of headache, body aches, chills, painful cough, generalized weakness, fatigue, nausea and intermittent episodes of vomiting.  She denies diarrhea.  She has been able to eat and drink.  She has a history of smoking but has not been able to smoke since she got sick 3 days ago.  She has had one of the vaccines against coronavirus but is not fully vaccinated.  She denies hemoptysis or severe shortness of breath.  HPI     Past Medical History:  Diagnosis Date  . Allergy   . Anemia   . Anxiety   . Blood transfusion without reported diagnosis   . COPD (chronic obstructive pulmonary disease) (HCC)   . Depression   . Pulmonary embolism (HCC)     There are no problems to display for this patient.   Past Surgical History:  Procedure Laterality Date  . ABDOMINAL HYSTERECTOMY    . HERNIA REPAIR    . Lypoma removal    . TUBAL LIGATION       OB History   No obstetric history on file.     Family History  Problem Relation Age of Onset  . Cancer Mother   . Alcohol abuse Mother   . Arthritis Mother   . Diabetes Mother   . Drug abuse Mother   . Hypertension Mother   . Miscarriages / [place] Mother   . Varicose Veins Mother   . Alcohol abuse Father   . Asthma Father   . Depression Father   . Diabetes Father   . Drug abuse Father   . Hyperlipidemia Father   . Hypertension Father   . Mental illness Father   . Varicose Veins Father   . Diabetes Sister   . Hypertension Sister   . Varicose Veins Sister     Social History   Tobacco Use  .  Smoking status: Current Some Day Smoker    Packs/day: 0.25    Years: 10.00    Pack years: 2.50    Types: Cigars  . Smokeless tobacco: Never Used  Vaping Use  . Vaping Use: Never used  Substance Use Topics  . Alcohol use: No  . Drug use: Yes    Frequency: 7.0 times per week    Types: Marijuana    Home Medications Prior to Admission medications   Medication Sig Start Date End Date Taking? Authorizing Provider  albuterol (PROVENTIL) (2.5 MG/3ML) 0.083% nebulizer solution Take 3 mLs (2.5 mg total) by nebulization every 6 (six) hours as needed for wheezing or shortness of breath. 07/23/19   Icard, Rachel Bo, DO  albuterol (VENTOLIN HFA) 108 (90 Base) MCG/ACT inhaler Inhale 1 puff into the lungs as needed. 11/04/18 11/04/19  [provider]  ALPRAZolam Prudy Feeler) 0.25 MG tablet Take 1 tablet (0.25 mg total) by mouth 2 (two) times daily. Patient not taking: Reported on 07/23/2019 01/21/15   Johna Sheriff, MD  apixaban (ELIQUIS) 5 MG TABS tablet Take 5 mg by mouth 2 (two) times daily.    [provider]  benzonatate (  TESSALON) 100 MG capsule Take 2 capsules (200 mg total) by mouth 3 (three) times daily as needed. 03/14/20   Burgess Amor, PA-C  busPIRone (BUSPAR) 7.5 MG tablet Take 2 tablets (15 mg total) by mouth 2 (two) times daily. Patient not taking: Reported on 07/23/2019 01/21/15   Johna Sheriff, MD  estradiol (ESTRACE) 1 MG tablet Take 0.5 mg by mouth daily.    [provider]  Fluticasone-Umeclidin-Vilant (TRELEGY ELLIPTA) 200-62.5-25 MCG/INH AEPB Inhale 1 puff into the lungs daily. 07/23/19   Josephine Igo, DO  megestrol (MEGACE) 40 MG tablet  01/19/15   [provider]    Allergies    Patient has no known allergies.  Review of Systems   Review of Systems Ten systems reviewed and are negative for acute change, except as noted in the HPI.  Physical Exam Updated Vital Signs BP 112/82 (BP Location: Right Arm)   Pulse 87   Temp 98.5 F (36.9 C) (Oral)    Resp 16   Ht 5\' 6"  (1.676 m)   Wt 54.9 kg   SpO2 98%   BMI 19.53 kg/m   Physical Exam Vitals and nursing note reviewed.  Constitutional:      General: She is not in acute distress.    Appearance: She is well-developed and well-nourished. She is ill-appearing. She is not toxic-appearing or diaphoretic.  HENT:     Head: Normocephalic and atraumatic.  Eyes:     General: No scleral icterus.    Conjunctiva/sclera: Conjunctivae normal.  Cardiovascular:     Rate and Rhythm: Normal rate and regular rhythm.     Heart sounds: Normal heart sounds. No murmur heard. No friction rub. No gallop.   Pulmonary:     Effort: Pulmonary effort is normal. No respiratory distress.     Breath sounds: Normal breath sounds.  Abdominal:     General: Bowel sounds are normal. There is no distension.     Palpations: Abdomen is soft. There is no mass.     Tenderness: There is no abdominal tenderness. There is no guarding.  Musculoskeletal:     Cervical back: Normal range of motion.  Skin:    General: Skin is warm and dry.  Neurological:     Mental Status: She is alert and oriented to person, place, and time.  Psychiatric:        Behavior: Behavior normal.     ED Results / Procedures / Treatments   Labs (all labs ordered are listed, but only abnormal results are displayed) Labs Reviewed - No data to display  EKG None  Radiology No results found.  Procedures Procedures (including critical care time)  Medications Ordered in ED Medications - No data to display  ED Course  I have reviewed the triage vital signs and the nursing notes.  Pertinent labs & imaging results that were available during my care of the patient were reviewed by me and considered in my medical decision making (see chart for details).    MDM Rules/Calculators/A&P                          Patient here with influenza-like illness.  She is afebrile with normal vital signs and hemodynamically stable.  Patient given  Zofran here in the emergency department.  Will discharge with a steroid taper.  Discussed over-the-counter treatment for her symptoms and return precautions as well as home isolation.   was evaluated in Emergency Department on 06/11/2020  for the symptoms described in the history of present illness. She was evaluated in the context of the global COVID-19 pandemic, which necessitated consideration that the patient might be at risk for infection with the SARS-CoV-2 virus that causes COVID-19. Institutional protocols and algorithms that pertain to the evaluation of patients at risk for COVID-19 are in a state of rapid change based on information released by regulatory bodies including the CDC and federal and state organizations. These policies and algorithms were followed during the patient's care in the ED.  Final Clinical Impression(s) / ED Diagnoses Final diagnoses:  None    Rx / DC Orders ED Discharge Orders    None       Arthor Captain, PA-C 06/11/20 1328    Horton, Clabe Seal, DO 06/11/20 1604

## 2020-06-11 NOTE — Discharge Instructions (Addendum)
Person Under Monitoring Name: Chloe Ortiz  Location: [address] [place] [address]   Infection Prevention Recommendations for Individuals Confirmed to have, or Being Evaluated for, 2019 Novel Coronavirus (COVID-19) Infection Who Receive Care at Home  Individuals who are confirmed to have, or are being evaluated for, COVID-19 should follow the prevention steps below until a healthcare provider or local or state health department says they can return to normal activities.  Stay home except to get medical care You should restrict activities outside your home, except for getting medical care. Do not go to work, school, or public areas, and do not use public transportation or taxis.  Call ahead before visiting your doctor Before your medical appointment, call the healthcare provider and tell them that you have, or are being evaluated for, COVID-19 infection. This will help the healthcare providers office take steps to keep other people from getting infected. Ask your healthcare provider to call the local or state health department.  Monitor your symptoms Seek prompt medical attention if your illness is worsening (e.g., difficulty breathing). Before going to your medical appointment, call the healthcare provider and tell them that you have, or are being evaluated for, COVID-19 infection. Ask your healthcare provider to call the local or state health department.  Wear a facemask You should wear a facemask that covers your nose and mouth when you are in the same room with other people and when you visit a healthcare provider. People who live with or visit you should also wear a facemask while they are in the same room with you.  Separate yourself from other people in your home As much as possible, you should stay in a different room from other people in your home. Also, you should use a separate bathroom, if available.  Avoid sharing household items You should not  share dishes, drinking glasses, cups, eating utensils, towels, bedding, or other items with other people in your home. After using these items, you should wash them thoroughly with soap and water.  Cover your coughs and sneezes Cover your mouth and nose with a tissue when you cough or sneeze, or you can cough or sneeze into your sleeve. Throw used tissues in a lined trash can, and immediately wash your hands with soap and water for at least 20 seconds or use an alcohol-based hand rub.  Wash your [employer] your hands often and thoroughly with soap and water for at least 20 seconds. You can use an alcohol-based hand sanitizer if soap and water are not available and if your hands are not visibly dirty. Avoid touching your eyes, nose, and mouth with unwashed hands.   Prevention Steps for Caregivers and Household Members of Individuals Confirmed to have, or Being Evaluated for, COVID-19 Infection Being Cared for in the Home  If you live with, or provide care at home for, a person confirmed to have, or being evaluated for, COVID-19 infection please follow these guidelines to prevent infection:  Follow healthcare providers instructions Make sure that you understand and can help the patient follow any healthcare provider instructions for all care.  Provide for the patients basic needs You should help the patient with basic needs in the home and provide support for getting groceries, prescriptions, and other personal needs.  Monitor the patients symptoms If they are getting sicker, call his or her medical provider and tell them that the patient has, or is being evaluated for, COVID-19 infection. This will help the healthcare providers office  take steps to keep other people from getting infected. Ask the healthcare provider to call the local or state health department.  Limit the number of people who have contact with the patient If possible, have only one caregiver for the  patient. Other household members should stay in another home or place of residence. If this is not possible, they should stay in another room, or be separated from the patient as much as possible. Use a separate bathroom, if available. Restrict visitors who do not have an essential need to be in the home.  Keep older adults, very young children, and other sick people away from the patient Keep older adults, very young children, and those who have compromised immune systems or chronic health conditions away from the patient. This includes people with chronic heart, lung, or kidney conditions, diabetes, and cancer.  Ensure good ventilation Make sure that shared spaces in the home have good air flow, such as from an air conditioner or an opened window, weather permitting.  Wash your hands often Wash your hands often and thoroughly with soap and water for at least 20 seconds. You can use an alcohol based hand sanitizer if soap and water are not available and if your hands are not visibly dirty. Avoid touching your eyes, nose, and mouth with unwashed hands. Use disposable paper towels to dry your hands. If not available, use dedicated cloth towels and replace them when they become wet.  Wear a facemask and gloves Wear a disposable facemask at all times in the room and gloves when you touch or have contact with the patients blood, body fluids, and/or secretions or excretions, such as sweat, saliva, sputum, nasal mucus, vomit, urine, or feces.  Ensure the mask fits over your nose and mouth tightly, and do not touch it during use. Throw out disposable facemasks and gloves after using them. Do not reuse. Wash your hands immediately after removing your facemask and gloves. If your personal clothing becomes contaminated, carefully remove clothing and launder. Wash your hands after handling contaminated clothing. Place all used disposable facemasks, gloves, and other waste in a lined container before  disposing them with other household waste. Remove gloves and wash your hands immediately after handling these items.  Do not share dishes, glasses, or other household items with the patient Avoid sharing household items. You should not share dishes, drinking glasses, cups, eating utensils, towels, bedding, or other items with a patient who is confirmed to have, or being evaluated for, COVID-19 infection. After the person uses these items, you should wash them thoroughly with soap and water.  Wash laundry thoroughly Immediately remove and wash clothes or bedding that have blood, body fluids, and/or secretions or excretions, such as sweat, saliva, sputum, nasal mucus, vomit, urine, or feces, on them. Wear gloves when handling laundry from the patient. Read and follow directions on labels of laundry or clothing items and detergent. In general, wash and dry with the warmest temperatures recommended on the label.  Clean all areas the individual has used often Clean all touchable surfaces, such as counters, tabletops, doorknobs, bathroom fixtures, toilets, phones, keyboards, tablets, and bedside tables, every day. Also, clean any surfaces that may have blood, body fluids, and/or secretions or excretions on them. Wear gloves when cleaning surfaces the patient has come in contact with. Use a diluted bleach solution (e.g., dilute bleach with 1 part bleach and 10 parts water) or a household disinfectant with a label that says EPA-registered for coronaviruses. To make a bleach  solution at home, add 1 tablespoon of bleach to 1 quart (4 cups) of water. For a larger supply, add  cup of bleach to 1 gallon (16 cups) of water. Read labels of cleaning products and follow recommendations provided on product labels. Labels contain instructions for safe and effective use of the cleaning product including precautions you should take when applying the product, such as wearing gloves or eye protection and making sure you  have good ventilation during use of the product. Remove gloves and wash hands immediately after cleaning.  Monitor yourself for signs and symptoms of illness Caregivers and household members are considered close contacts, should monitor their health, and will be asked to limit movement outside of the home to the extent possible. Follow the monitoring steps for close contacts listed on the symptom monitoring form.   ? If you have additional questions, contact your local health department or call the epidemiologist on call at [phone] (available 24/7). ? This guidance is subject to change. For the most up-to-date guidance from [facility], please refer to their website: [email or web address]

## 2020-06-15 ENCOUNTER — Emergency Department (HOSPITAL_COMMUNITY)
Admission: EM | Admit: 2020-06-15 | Discharge: 2020-06-15 | Discharge status: Against Medical Advice | Payer: Medicaid Other

## 2020-06-15 ENCOUNTER — Other Ambulatory Visit: Payer: Self-pay

## 2020-06-25 ENCOUNTER — Emergency Department (HOSPITAL_COMMUNITY)
Admission: EM | Admit: 2020-06-25 | Discharge: 2020-06-25 | Disposition: A | Discharge status: Home / Self Care | Payer: Medicaid Other | Attending: Emergency Medicine | Admitting: Emergency Medicine

## 2020-06-25 ENCOUNTER — Other Ambulatory Visit: Payer: Self-pay

## 2020-06-25 ENCOUNTER — Emergency Department (HOSPITAL_COMMUNITY): Payer: Medicaid Other

## 2020-06-25 ENCOUNTER — Encounter (HOSPITAL_COMMUNITY): Payer: Self-pay

## 2020-06-25 DIAGNOSIS — Z86711 Personal history of pulmonary embolism: Secondary | ICD-10-CM | POA: Insufficient documentation

## 2020-06-25 DIAGNOSIS — R079 Chest pain, unspecified: Secondary | ICD-10-CM

## 2020-06-25 DIAGNOSIS — J449 Chronic obstructive pulmonary disease, unspecified: Secondary | ICD-10-CM | POA: Insufficient documentation

## 2020-06-25 DIAGNOSIS — Z7951 Long term (current) use of inhaled steroids: Secondary | ICD-10-CM | POA: Diagnosis not present

## 2020-06-25 DIAGNOSIS — F17290 Nicotine dependence, other tobacco product, uncomplicated: Secondary | ICD-10-CM | POA: Diagnosis not present

## 2020-06-25 DIAGNOSIS — U071 COVID-19: Secondary | ICD-10-CM | POA: Diagnosis not present

## 2020-06-25 DIAGNOSIS — R071 Chest pain on breathing: Secondary | ICD-10-CM

## 2020-06-25 DIAGNOSIS — R059 Cough, unspecified: Secondary | ICD-10-CM | POA: Diagnosis present

## 2020-06-25 DIAGNOSIS — R0789 Other chest pain: Secondary | ICD-10-CM

## 2020-06-25 LAB — CBC
HCT: 37.8 % (ref 36.0–46.0)
Hemoglobin: 12.7 g/dL (ref 12.0–15.0)
MCH: 30.6 pg (ref 26.0–34.0)
MCHC: 33.6 g/dL (ref 30.0–36.0)
MCV: 91.1 fL (ref 80.0–100.0)
Platelets: 274 10*3/uL (ref 150–400)
RBC: 4.15 MIL/uL (ref 3.87–5.11)
RDW: 13 % (ref 11.5–15.5)
WBC: 7.7 10*3/uL (ref 4.0–10.5)
nRBC: 0 % (ref 0.0–0.2)

## 2020-06-25 LAB — BASIC METABOLIC PANEL
Anion gap: 8 (ref 5–15)
BUN: 14 mg/dL (ref 6–20)
CO2: 25 mmol/L (ref 22–32)
Calcium: 9.6 mg/dL (ref 8.9–10.3)
Chloride: 104 mmol/L (ref 98–111)
Creatinine, Ser: 0.64 mg/dL (ref 0.44–1.00)
GFR, Estimated: >60 mL/min (ref >60)
Glucose, Bld: 87 mg/dL (ref 70–99)
Potassium: 3.8 mmol/L (ref 3.5–5.1)
Sodium: 137 mmol/L (ref 135–145)

## 2020-06-25 LAB — D-DIMER, QUANTITATIVE: D-Dimer, Quant: 0.3 ug/mL-FEU (ref 0.00–0.50)

## 2020-06-25 LAB — TROPONIN I (HIGH SENSITIVITY): Troponin I (High Sensitivity): <2 ng/L (ref <18)

## 2020-06-25 MED ORDER — ASPIRIN 325 MG PO TABS
325.0000 mg | ORAL_TABLET | Freq: Once | ORAL | Status: AC
  Administered 2020-06-25: 325 mg via ORAL
  Filled 2020-06-25: qty 1

## 2020-06-25 MED ORDER — ALBUTEROL SULFATE HFA 108 (90 BASE) MCG/ACT IN AERS
2.0000 | INHALATION_SPRAY | Freq: Once | RESPIRATORY_TRACT | Status: AC
  Administered 2020-06-25: 2 via RESPIRATORY_TRACT
  Filled 2020-06-25: qty 6.7

## 2020-06-25 NOTE — ED Triage Notes (Signed)
Pt here via [facility] from health dept where she c/o chest pain x 2 months, hx of PE, CBG 103 with EMS. Reproducible with palpation and inspiration. Pressure pain that radiates in a sharp pain to left arm.

## 2020-06-25 NOTE — ED Provider Notes (Signed)
[facility] EMERGENCY DEPARTMENT Provider Note   CSN: [id number] Arrival date & time: 06/25/20  1237     History Chief Complaint  Patient presents with  . Chest Pain    Chloe Ortiz is a 45 y.o. female.  HPI   Patient with significant medical history of anemia, COPD, PE diagnosed at [facility] completed 6 months course of Xarelto, presents with chief complaint of chest pain x2 months.  Patient endorses she has had chest pain since she stopped her Xarelto, states it is intermediate, she has sharp and dull pains, sometimes associated with shortness of breath, does not radiate, states it stays in the substernal and left side of her chest.  She has no cardiac history, no history of AAA's, Marfan's disease, denies hormone use, no recent leg swelling or recent travels.  She endorses that the pain is reproducible when she moves around a lot or someone presses her chest, denies exertion making the pain worse.  She does endorse that she was recently Covid positive on the 27th, continues to have nasal congestion and a cough, does not think the cough makes the chest pain worse.  She is unsure whether food makes the pain worse.  She has no GI history.  Patient denies alleviating factors.  Patient denies headaches, fevers, chills, abdominal pain, nausea, vomiting, diarrhea, worsening pedal edema.  Past Medical History:  Diagnosis Date  . Allergy   . Anemia   . Anxiety   . Blood transfusion without reported diagnosis   . COPD (chronic obstructive pulmonary disease) (HCC)   . Depression   . Pulmonary embolism (HCC)     There are no problems to display for this patient.   Past Surgical History:  Procedure Laterality Date  . ABDOMINAL HYSTERECTOMY    . HERNIA REPAIR    . Lypoma removal    . TUBAL LIGATION       OB History   No obstetric history on file.     Family History  Problem Relation Age of Onset  . Cancer Mother   . Alcohol abuse Mother   . Arthritis Mother   .  Diabetes Mother   . Drug abuse Mother   . Hypertension Mother   . Miscarriages / [place] Mother   . Varicose Veins Mother   . Alcohol abuse Father   . Asthma Father   . Depression Father   . Diabetes Father   . Drug abuse Father   . Hyperlipidemia Father   . Hypertension Father   . Mental illness Father   . Varicose Veins Father   . Diabetes Sister   . Hypertension Sister   . Varicose Veins Sister     Social History   Tobacco Use  . Smoking status: Current Some Day Smoker    Packs/day: 0.25    Years: 10.00    Pack years: 2.50    Types: Cigars  . Smokeless tobacco: Never Used  Vaping Use  . Vaping Use: Never used  Substance Use Topics  . Alcohol use: No  . Drug use: Yes    Frequency: 7.0 times per week    Types: Marijuana    Home Medications Prior to Admission medications   Medication Sig Start Date End Date Taking? Authorizing Provider  albuterol (PROVENTIL) (2.5 MG/3ML) 0.083% nebulizer solution Take 3 mLs (2.5 mg total) by nebulization every 6 (six) hours as needed for wheezing or shortness of breath. 07/23/19  Yes Icard, Bradley L, DO  albuterol (VENTOLIN HFA) 108 (90 Base)  MCG/ACT inhaler Inhale 1 puff into the lungs as needed for wheezing or shortness of breath. 11/04/18 06/25/20 Yes [provider]  ALPRAZolam Prudy Feeler) 0.25 MG tablet Take 1 tablet (0.25 mg total) by mouth 2 (two) times daily. 01/21/15  Yes Johna Sheriff, MD  estradiol (ESTRACE) 1 MG tablet Take 0.5 mg by mouth daily.   Yes [provider]  Fluticasone-Umeclidin-Vilant (TRELEGY ELLIPTA) 200-62.5-25 MCG/INH AEPB Inhale 1 puff into the lungs daily. 07/23/19  Yes Icard, Bradley L, DO  Ibuprofen 200 MG CAPS Take 200 mg by mouth daily as needed (pain).   Yes [provider]  raNITIdine HCl (ZANTAC PO) Take 1 tablet by mouth daily as needed (heartburn).   Yes [provider]  Simethicone (GAS-X PO) Take 1 tablet by mouth daily as needed (for gas).   Yes [provider]  benzonatate (TESSALON) 100 MG capsule Take 2 capsules (200 mg total) by mouth 3 (three) times daily as needed. Patient not taking: No sig reported 03/14/20   Burgess Amor, PA-C  busPIRone (BUSPAR) 7.5 MG tablet Take 2 tablets (15 mg total) by mouth 2 (two) times daily. Patient not taking: No sig reported 01/21/15   Johna Sheriff, MD  methylPREDNISolone (MEDROL DOSEPAK) 4 MG TBPK tablet Use as directed Patient not taking: No sig reported 06/11/20   Arthor Captain, PA-C  ondansetron (ZOFRAN ODT) 4 MG disintegrating tablet Take 1 tablet (4 mg total) by mouth every 8 (eight) hours as needed for nausea or vomiting. Patient not taking: No sig reported 06/11/20   Arthor Captain, PA-C    Allergies    Patient has no known allergies.  Review of Systems   Review of Systems  Constitutional: Negative for chills and fever.  HENT: Positive for congestion. Negative for sore throat.   Eyes: Negative for visual disturbance.  Respiratory: Positive for cough and shortness of breath.   Cardiovascular: Positive for chest pain. Negative for palpitations and leg swelling.  Gastrointestinal: Negative for abdominal pain, diarrhea, nausea and vomiting.  Genitourinary: Negative for dysuria and enuresis.  Musculoskeletal: Negative for back pain.  Skin: Negative for rash.  Neurological: Negative for dizziness and headaches.  Hematological: Does not bruise/bleed easily.    Physical Exam Updated Vital Signs BP (!) 134/95   Pulse (!) 58   Temp 98.3 F (36.8 C)   Resp 16   Ht 5\' 5"  (1.651 m)   Wt 52.6 kg   SpO2 100%   BMI 19.30 kg/m   Physical Exam Vitals and nursing note reviewed.  Constitutional:      General: She is not in acute distress.    Appearance: She is not ill-appearing.  HENT:     Head: Normocephalic and atraumatic.     Nose: No congestion.  Eyes:     Conjunctiva/sclera: Conjunctivae normal.  Cardiovascular:     Rate and Rhythm: Normal rate and regular rhythm.      Pulses: Normal pulses.     Heart sounds: No murmur heard. No friction rub. No gallop.   Pulmonary:     Effort: No respiratory distress.     Breath sounds: No wheezing, rhonchi or rales.  Abdominal:     Palpations: Abdomen is soft.     Tenderness: There is no abdominal tenderness.  Musculoskeletal:     Right lower leg: No edema.     Left lower leg: No edema.     Comments: Patient's chest wall was palpated, it was is tender to palpation along patient's third  and fourth rib midclavicular line, states it feels like the pain she was feeling.  She also states moving her left arm makes the pain worse.  No flail chest noted, no deformities or crepitus noted on exam.  Patient is moving all 4 extremities at difficulty.  Skin:    General: Skin is warm and dry.  Neurological:     Mental Status: She is alert.  Psychiatric:        Mood and Affect: Mood normal.     ED Results / Procedures / Treatments   Labs (all labs ordered are listed, but only abnormal results are displayed) Labs Reviewed  BASIC METABOLIC PANEL  CBC  D-DIMER, QUANTITATIVE (NOT AT [facility])  TROPONIN I (HIGH SENSITIVITY)    EKG EKG Interpretation  Date/Time:  Friday June 25 2020 12:44:16 EST Ventricular Rate:  54 PR Interval:    QRS Duration: 73 QT Interval:  423 QTC Calculation: 401 R Axis:   48 Text Interpretation: Sinus rhythm RSR' in V1 or V2, probably normal variant No STEMI Confirmed by Alvester Chou [phone]) on 06/25/2020 2:22:02 PM   Radiology DG Chest 1 View  Result Date: 06/25/2020 CLINICAL DATA:  Chest pain EXAM: CHEST  1 VIEW COMPARISON:  May 26, 2020 FINDINGS: The cardiomediastinal silhouette is unchanged in contour. No pleural effusion. No pneumothorax. No acute pleuroparenchymal abnormality. Visualized abdomen is unremarkable. Levocurvature of the superior thoracic spine and dextrocurvature of the inferior thoracic spine. IMPRESSION: No acute cardiopulmonary abnormality. Electronically Signed    By: Meda Klinefelter MD   On: 06/25/2020 13:23    Procedures Procedures   Medications Ordered in ED Medications  aspirin tablet 325 mg (325 mg Oral Given 06/25/20 1340)  albuterol (VENTOLIN HFA) 108 (90 Base) MCG/ACT inhaler 2 puff (2 puffs Inhalation Given 06/25/20 1340)    ED Course  I have reviewed the triage vital signs and the nursing notes.  Pertinent labs & imaging results that were available during my care of the patient were reviewed by me and considered in my medical decision making (see chart for details).    MDM Rules/Calculators/A&P                          Initial impression-patient presents with chest pain x2 months.  She is alert, does not appear in acute distress, vital signs reassuring.  Concern for PE, AAA, ACS, pneumonia.  Will obtain basic lab work-up, chest x-ray, EKG, add on a D-dimer due to patient's PE history.  Will provide her with albuterol and reevaluate.  Work-up-CBC within normal limits.  BMP within normal limits.  D-dimer 0.3.  Mr. Troponin less than 2. Chest x-ray does not reveal any acute findings.  EKG sinus bradycardia without signs of ischemia no ST elevation depression noted.  Reassessment patient was reassessed after providing her with albuterol, she states she feels much better, states the chest pressure and pain has slightly improved.  She has no complaints at this time.  Vital signs have remained stable.  Patient ambulated without difficulty, no complaints of chest pain or shortness of breath, O2 sat stayed in the upper 90s.  Rule out- I have low suspicion for ACS as history is atypical, patient has no cardiac history, EKG is without signs of ischemia, patient patient had a initial troponin of less than 2.  Will defer second troponin as she has had chest pains greater than 12 hours with initial troponin less than 2 making ACS unlikely as I would  expect increasing troponin at this time. low Suspicion for PE as D-dimer is 0.3, no pedal edema noted my  exam, vital signs reassuring, O2 sats remain in the upper 90s with ambulation.  Low suspicion for AAA or aortic dissection as history is atypical, patient has low risk factors.  Low suspicion for systemic infection as patient is nontoxic-appearing, vital signs reassuring, no obvious source infection noted on exam.  Low suspicion for pneumonia as chest x-ray is negative, lung sounds are clear bilaterally.   Plan-suspect patient suffering from possible post COVID bronchitis as patient endorsed chest pain and improved with albuterol but differential diagnosis includes muscular strain, costochondritis, anxiety, GERD.  Will recommend she continue with her current recommendations and follow-up with her cardiologist and/or PCP for further evaluation.  Vital signs have remained stable, no indication for hospital admission.  Patient discussed with attending and they agreed with assessment and plan.  Patient given at home care as well strict return precautions.  Patient verbalized that they understood agreed to said plan.   Final Clinical Impression(s) / ED Diagnoses Final diagnoses:  Atypical chest pain    Rx / DC Orders ED Discharge Orders    None       Carroll Sage, PA-C 06/25/20 1448    Terald Sleeper, MD 06/25/20 [phone]

## 2020-06-25 NOTE — ED Notes (Signed)
Pt 100% on RA and maintained O2 sat of 100% on RA during ambulation.

## 2020-06-25 NOTE — Discharge Instructions (Signed)
Seen here for chest pain.  Lab work and imaging looks reassuring.  I recommend continuing with your medications as prescribed.  You may take over-the-counter pain medications like ibuprofen and or Tylenol every 6 hour as needed please follow dosage on the back of bottle.  Recommend you follow-up with a cardiologist for further evaluation.  Given the contact above please call.  Come back to the emergency department if you develop chest pain, shortness of breath, severe abdominal pain, uncontrolled nausea, vomiting, diarrhea.

## 2020-07-01 ENCOUNTER — Ambulatory Visit: Payer: Medicaid Other | Admitting: Pulmonary Disease

## 2020-07-06 ENCOUNTER — Ambulatory Visit: Payer: Medicaid Other | Admitting: Adult Health

## 2020-11-22 IMAGING — CT CT ANGIO CHEST
2 of 6 series · 19 of 46 positions shown · IV contrast (Omnipaque or Isovue)
Comparison: 10/06/2019

CLINICAL DATA: Left chest pain, cough x3 days, history of PE, high
probability PE

EXAM:
CT ANGIOGRAPHY CHEST WITH CONTRAST
TECHNIQUE: Multidetector CT imaging of the chest was performed using the
standard protocol during bolus administration of intravenous
contrast. Multiplanar CT image reconstructions and MIPs were
obtained to evaluate the vascular anatomy.
CONTRAST:  75mL OMNIPAQUE IOHEXOL 350 MG/ML SOLN

[Series 5: pe axial thins · axial · 0.64mm/px · z∈[+1140,+1400]mm · 16 of 286 slices shown]
[im 13/286  lung]
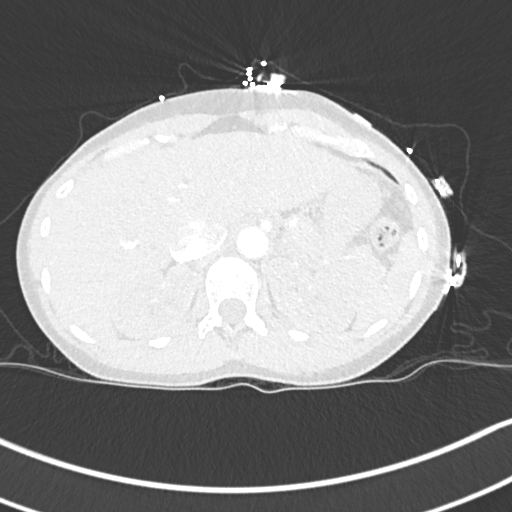
[im 38/286  soft-tissue]
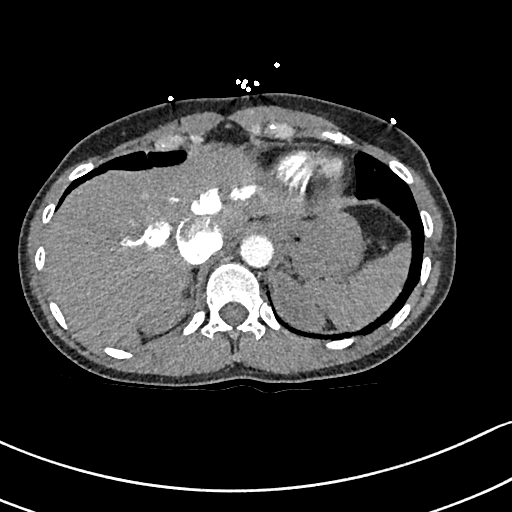
[im 50/286  lung]
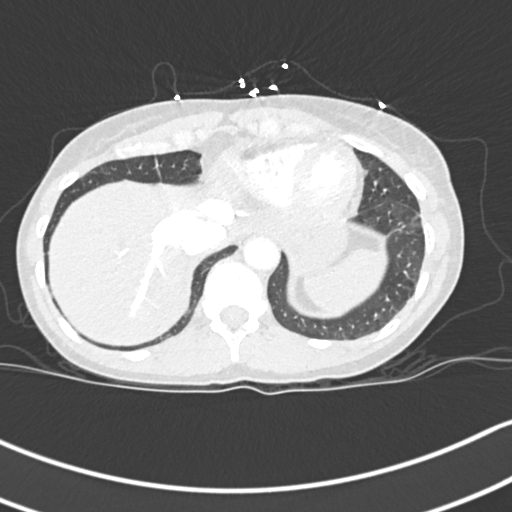
[im 62/286  soft-tissue]
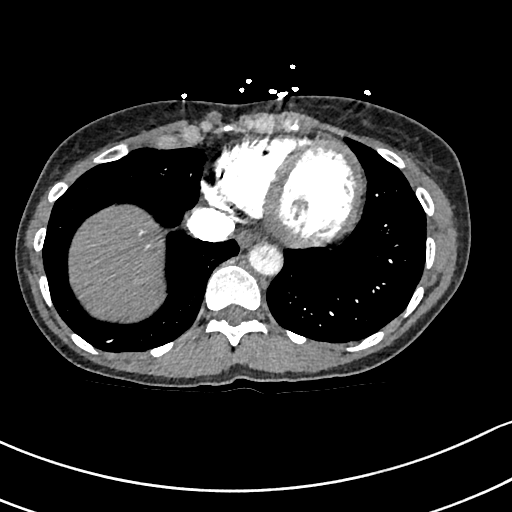
[im 87/286  lung]
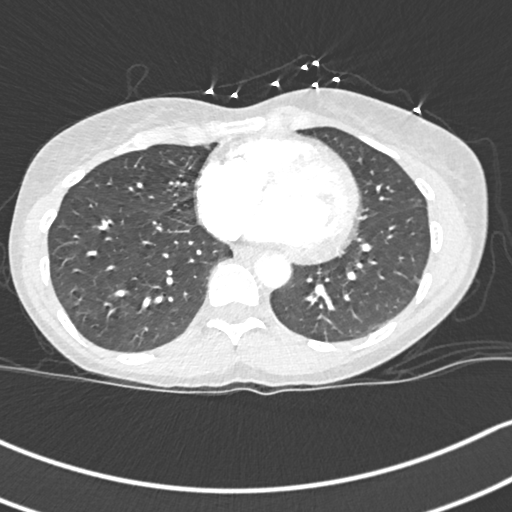
[im 100/286  soft-tissue]
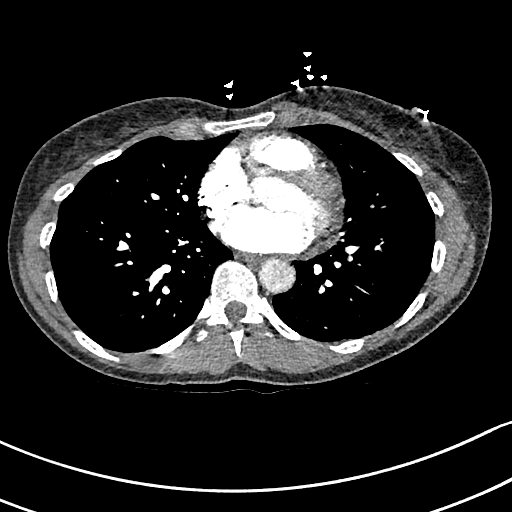
[im 112/286  lung]
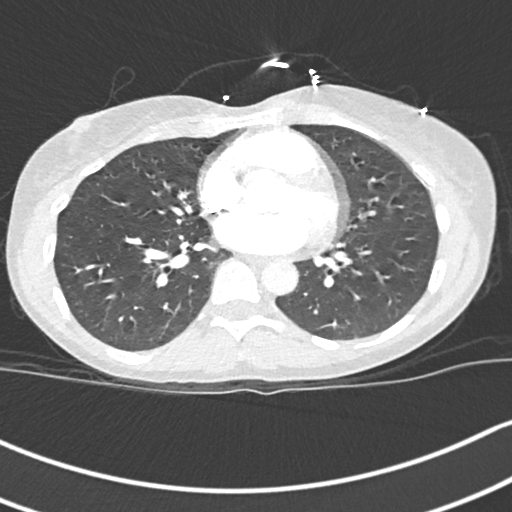
[im 137/286  soft-tissue]
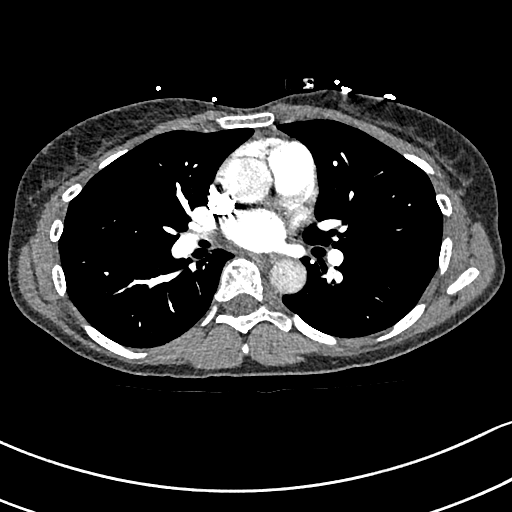
[im 149/286  lung]
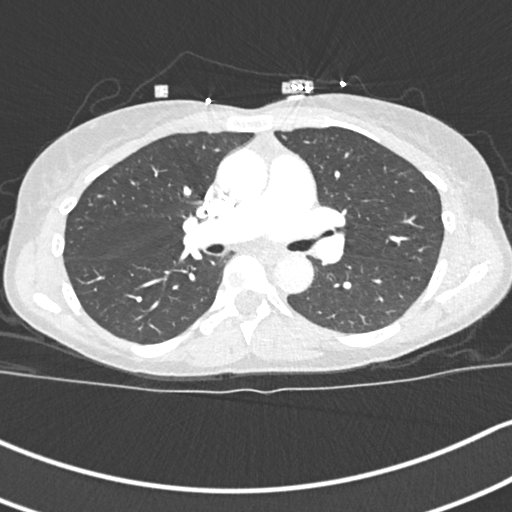
[im 174/286  soft-tissue]
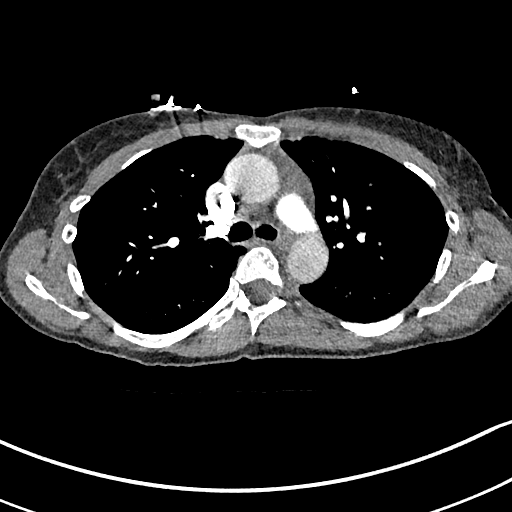
[im 186/286  lung]
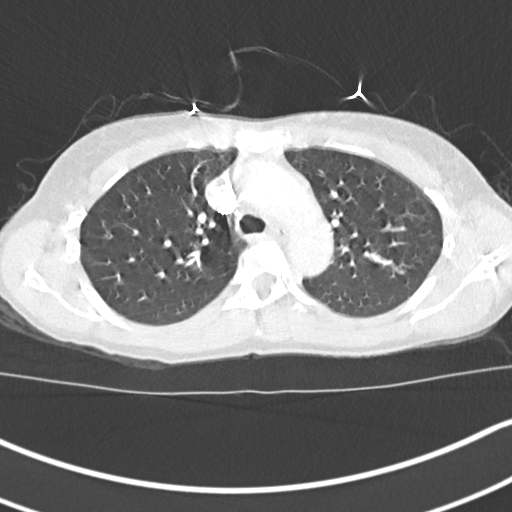
[im 199/286  soft-tissue]
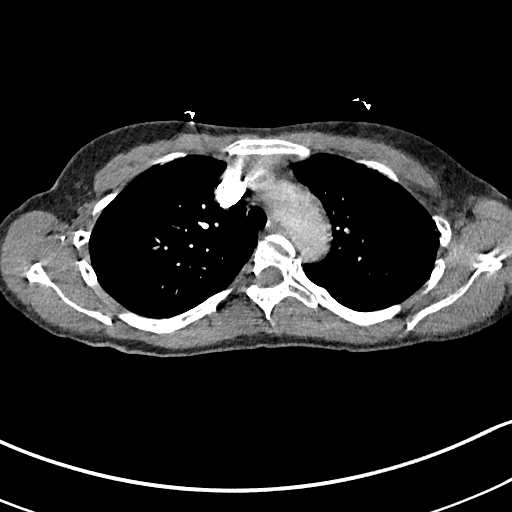
[im 224/286  lung]
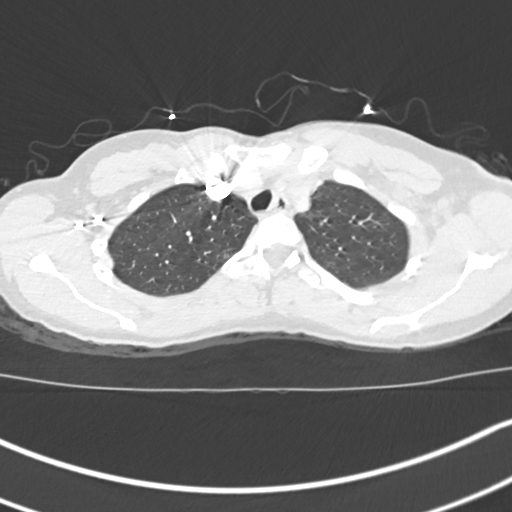
[im 236/286  soft-tissue]
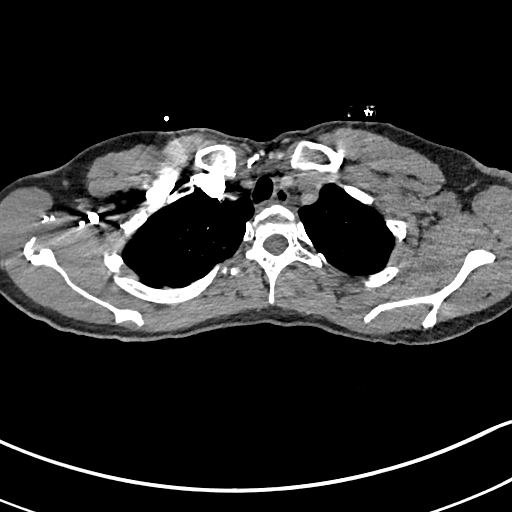
[im 248/286  lung]
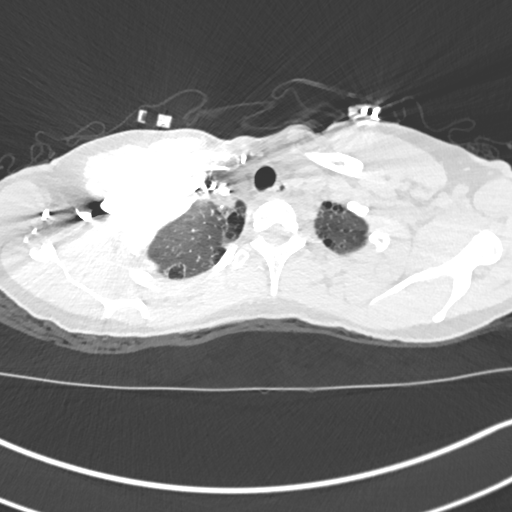
[im 273/286  soft-tissue]
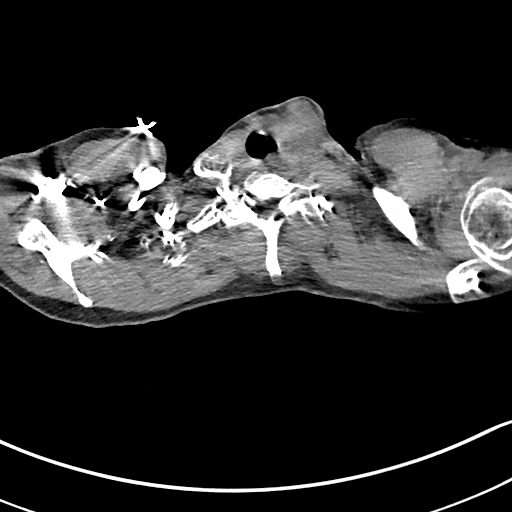

[Series 7: cor soft · coronal · 0.63mm/px · 3 of 107 slices shown]
[im 27/107  soft-tissue]
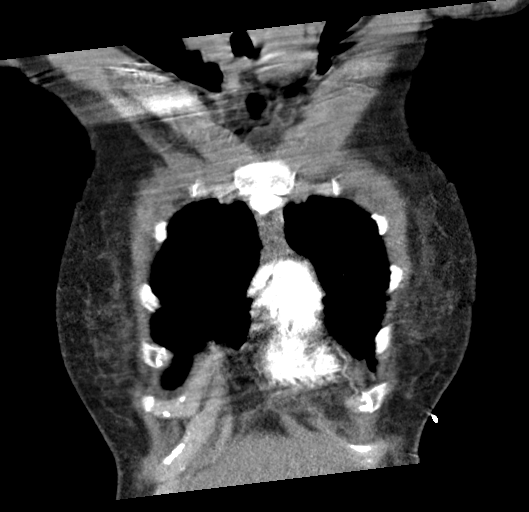
[im 54/107  soft-tissue]
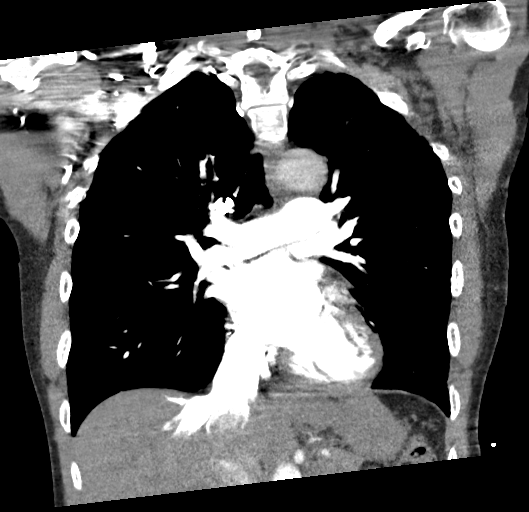
[im 80/107  soft-tissue]
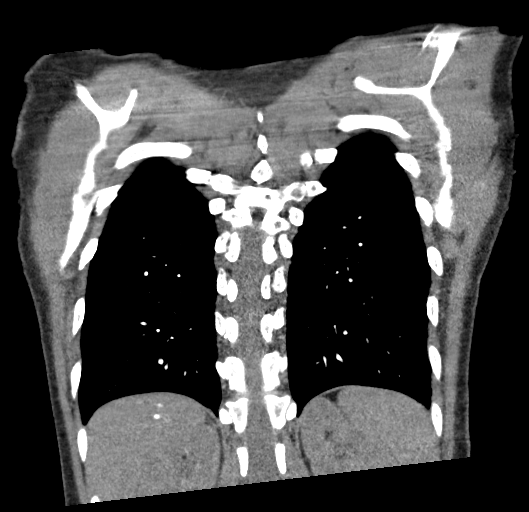

[19 of 46 positions shown; findings below may reference images not displayed]

FINDINGS: Cardiovascular: Heart size upper limits normal. No pericardial
effusion. Some reflux of contrast from the right atrium into hepatic
veins. Satisfactory opacification of pulmonary arteries noted, and
there is no evidence of pulmonary emboli. Fair contrast
opacification of the thoracic aorta. No suggestion of aneurysm,
dissection, or stenosis. Classic 3 vessel brachiocephalic arterial
origin anatomy without proximal stenosis.

Mediastinum/Nodes: No mass or adenopathy.

Lungs/Pleura: No pleural effusion. No pneumothorax. Small subpleural
blebs in the apices. Lungs otherwise clear.

Upper Abdomen: No acute findings.

Musculoskeletal: No chest wall abnormality. No acute or significant
osseous findings.

Review of the MIP images confirms the above findings.
IMPRESSION: Negative for acute PE or thoracic aortic dissection.

## 2021-03-05 IMAGING — DX DG CHEST 1V
1 series · 1 of 1 positions shown · non-contrast
Comparison: May 26, 2020

CLINICAL DATA: Chest pain

EXAM:
CHEST  1 VIEW

[chest ap]
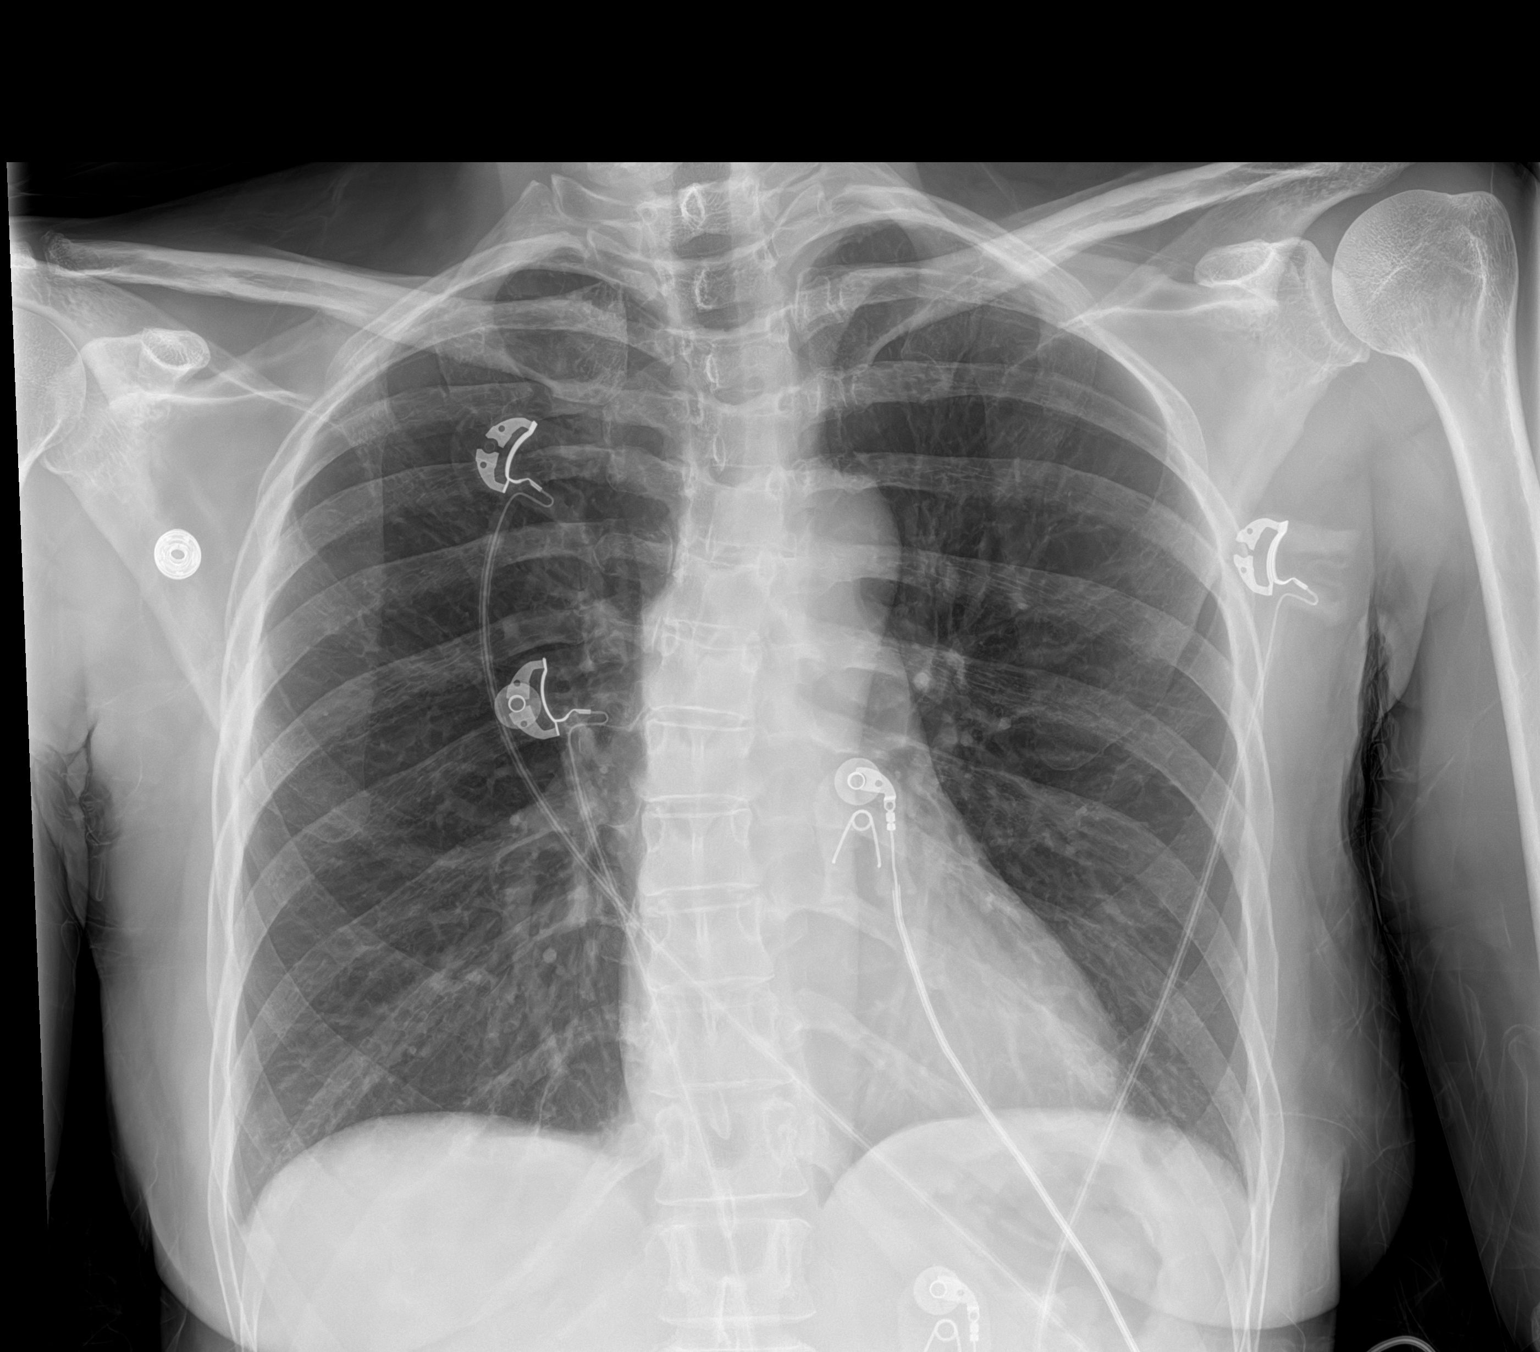

[1 of 1 positions shown; findings below may reference images not displayed]

FINDINGS: The cardiomediastinal silhouette is unchanged in contour. No pleural
effusion. No pneumothorax. No acute pleuroparenchymal abnormality.
Visualized abdomen is unremarkable. Levocurvature of the superior
thoracic spine and dextrocurvature of the inferior thoracic spine.
IMPRESSION: No acute cardiopulmonary abnormality.

## 2021-07-19 ENCOUNTER — Emergency Department (HOSPITAL_COMMUNITY)
Admission: EM | Admit: 2021-07-19 | Discharge: 2021-07-19 | Disposition: A | Discharge status: Home / Self Care | Payer: Medicaid Other | Attending: Emergency Medicine | Admitting: Emergency Medicine

## 2021-07-19 ENCOUNTER — Encounter (HOSPITAL_COMMUNITY): Payer: Self-pay | Admitting: Emergency Medicine

## 2021-07-19 ENCOUNTER — Other Ambulatory Visit: Payer: Self-pay

## 2021-07-19 DIAGNOSIS — H1033 Unspecified acute conjunctivitis, bilateral: Secondary | ICD-10-CM

## 2021-07-19 DIAGNOSIS — H1089 Other conjunctivitis: Secondary | ICD-10-CM | POA: Insufficient documentation

## 2021-07-19 DIAGNOSIS — H5789 Other specified disorders of eye and adnexa: Secondary | ICD-10-CM | POA: Diagnosis present

## 2021-07-19 MED ORDER — FLUORESCEIN SODIUM 1 MG OP STRP
1.0000 | ORAL_STRIP | Freq: Once | OPHTHALMIC | Status: AC
  Administered 2021-07-19: 1 via OPHTHALMIC
  Filled 2021-07-19: qty 1

## 2021-07-19 MED ORDER — SULFACETAMIDE SODIUM 10 % OP SOLN: 1.0000 [drp] | Freq: Four times a day (QID) | OPHTHALMIC | 0 refills | Status: DC

## 2021-07-19 NOTE — ED Triage Notes (Addendum)
Pt c/o exudate coming from left eye over one week. Started to right eye 2 days ago. Sclera red bilateral

## 2021-07-19 NOTE — Discharge Instructions (Signed)
Likely you have a bacterial fraction your eyes, started on eyedrops please take as prescribed.  Please follow-up with an eye doctor in the next week's time for reevaluation come back if your symptoms are worsening.  Come back to the emergency department if you develop chest pain, shortness of breath, severe abdominal pain, uncontrolled nausea, vomiting, diarrhea.

## 2021-07-19 NOTE — ED Provider Notes (Signed)
[facility] EMERGENCY DEPARTMENT Provider Note   CSN: [id number] Arrival date & time: 07/19/21  1612     History  Chief Complaint  Patient presents with   Eye Pain    Chloe Ortiz is a 46 y.o. female.  HPI  Patient without significant medical history presents with complaints of bilateral sclera injection.  Patient states that it started in her left eye about a week ago, and a couple days later it went into her right eye.  She states that she feels like a gritty like sensation in her eyes when she is blinking, denies any change in vision, has difficulty opening up her eyes in the morning due to being stuck,, no difficulty  with eye movements, no swelling or redness around her eyes, she does not wear contacts, never had this in the past.  She has no other complaints.  She denies any trauma to the area.  Home Medications Prior to Admission medications   Medication Sig Start Date End Date Taking? Authorizing Provider  sulfacetamide (BLEPH-10) 10 % ophthalmic solution Place 1 drop into both eyes every 6 (six) hours for 6 days. 07/19/21 07/25/21 Yes Marcello Fennel, PA-C  albuterol (PROVENTIL) (2.5 MG/3ML) 0.083% nebulizer solution Take 3 mLs (2.5 mg total) by nebulization every 6 (six) hours as needed for wheezing or shortness of breath. 07/23/19   Icard, Octavio Graves, DO  albuterol (VENTOLIN HFA) 108 (90 Base) MCG/ACT inhaler Inhale 1 puff into the lungs as needed for wheezing or shortness of breath. 11/04/18 06/25/20  [provider]  ALPRAZolam Duanne Moron) 0.25 MG tablet Take 1 tablet (0.25 mg total) by mouth 2 (two) times daily. 01/21/15   Eustaquio Maize, MD  benzonatate (TESSALON) 100 MG capsule Take 2 capsules (200 mg total) by mouth 3 (three) times daily as needed. Patient not taking: No sig reported 03/14/20   Evalee Jefferson, PA-C  busPIRone (BUSPAR) 7.5 MG tablet Take 2 tablets (15 mg total) by mouth 2 (two) times daily. Patient not taking: No sig reported 01/21/15   Eustaquio Maize,  MD  estradiol (ESTRACE) 1 MG tablet Take 0.5 mg by mouth daily.    [provider]  Fluticasone-Umeclidin-Vilant (TRELEGY ELLIPTA) 200-62.5-25 MCG/INH AEPB Inhale 1 puff into the lungs daily. 07/23/19   Garner Nash, DO  Ibuprofen 200 MG CAPS Take 200 mg by mouth daily as needed (pain).    [provider]  methylPREDNISolone (MEDROL DOSEPAK) 4 MG TBPK tablet Use as directed Patient not taking: No sig reported 06/11/20   Margarita Mail, PA-C  ondansetron (ZOFRAN ODT) 4 MG disintegrating tablet Take 1 tablet (4 mg total) by mouth every 8 (eight) hours as needed for nausea or vomiting. Patient not taking: No sig reported 06/11/20   Margarita Mail, PA-C  raNITIdine HCl (ZANTAC PO) Take 1 tablet by mouth daily as needed (heartburn).    [provider]  Simethicone (GAS-X PO) Take 1 tablet by mouth daily as needed (for gas).    [provider]      Allergies    Patient has no known allergies.    Review of Systems   Review of Systems  Constitutional:  Negative for chills and fever.  Eyes:  Positive for discharge and redness. Negative for photophobia, pain and visual disturbance.  Respiratory:  Negative for shortness of breath.   Cardiovascular:  Negative for chest pain.  Gastrointestinal:  Negative for abdominal pain.  Neurological:  Negative for headaches.   Physical Exam Updated Vital Signs BP  105/72 (BP Location: Right Arm)    Pulse 79    Temp 98.4 F (36.9 C) (Oral)    Resp 17    SpO2 100%  Physical Exam Vitals and nursing note reviewed.  Constitutional:      General: She is not in acute distress.    Appearance: She is not ill-appearing.  HENT:     Head: Normocephalic and atraumatic.     Nose: No congestion.  Eyes:     Extraocular Movements: Extraocular movements intact.     Conjunctiva/sclera: Conjunctivae normal.     Pupils: Pupils are equal, round, and reactive to light.     Comments: Patient has noted slight scleral injection in both  eyes, EOMs were intact, PERRLA, no blood noted in the anterior trim the eye, no dendritic lesion, no ulceration present.   no periorbital involvement.  Cardiovascular:     Rate and Rhythm: Normal rate and regular rhythm.     Pulses: Normal pulses.  Pulmonary:     Effort: Pulmonary effort is normal.  Skin:    General: Skin is warm and dry.  Neurological:     Mental Status: She is alert.  Psychiatric:        Mood and Affect: Mood normal.    ED Results / Procedures / Treatments   Labs (all labs ordered are listed, but only abnormal results are displayed) Labs Reviewed - No data to display  EKG None  Radiology No results found.  Procedures Procedures    Medications Ordered in ED Medications  fluorescein ophthalmic strip 1 strip (1 strip Both Eyes Given 07/19/21 1759)    ED Course/ Medical Decision Making/ A&P                           Medical Decision Making Risk Prescription drug management.   This patient presents to the ED for concern of sclera injection, this involves an extensive number of treatment options, and is a complaint that carries with it a high risk of complications and morbidity.  The differential diagnosis includes bacterial ulceration, orbital cellulitis, corneal abrasion    Additional history obtained:  Additional history obtained from N/A External records from outside source obtained and reviewed including N/A   Co morbidities that complicate the patient evaluation  N/A  Social Determinants of Health:  N/A     Lab Tests:  I Ordered, and personally interpreted labs.  The pertinent results include:      Visual Acuity  Right Eye Distance: 20/25 Left Eye Distance: 20/20 Bilateral Distance:    Using a Woods lamp and fluorescein stain there lesion present, no pooling of the dye.      Imaging Studies ordered:  I ordered imaging studies including N/A    Cardiac Monitoring:  The patient was maintained on a cardiac monitor.  I  personally viewed and interpreted the cardiac monitored which showed an underlying rhythm of: N/A   Medicines ordered and prescription drug management:  I ordered medication including N/A I have reviewed the patients home medicines and have made adjustments as needed   Reevaluation:  Presents with scleral injection consistent with likely bacterial conjunctivitis, will visual acuity as well as reassess with a Woods lamp and fluorescein stain.  Patient tolerated the procedure well has no complaints she is agreeable for discharge.  Rule out I have low suspicion for orbital cellulitis or periseptal cellulitis as patient had no pain with EOMs, there is no induration, fluctuance noted on  exam around eyes.  Low suspicion for keratitis as patient does not wear contacts, eyes were visualized there is no visible dendritic lesion noted, there is no severe amount of purulent discharge noted. Low suspicion for hyphema as patient denies recent trauma to the area no blood noted in the anterior chamber.  Low suspicion for corneal abrasion as there is no pulling or defect present on Woods lamp evaluation.     Dispostion and problem list  After consideration of the diagnostic results and the patients response to treatment, I feel that the patent would benefit from discharge.  Sclera injection-possible bacterial conjunctivitis, will start her on antibiotics, have her follow-up with ophthalmology, gave strict return precautions.            Final Clinical Impression(s) / ED Diagnoses Final diagnoses:  Acute bacterial conjunctivitis of both eyes    Rx / DC Orders ED Discharge Orders          Ordered    sulfacetamide (BLEPH-10) 10 % ophthalmic solution  Every 6 hours        07/19/21 1813              Marcello Fennel, PA-C 07/19/21 1814    Jeanell Sparrow, DO 07/20/21 [phone]

## 2021-07-20 ENCOUNTER — Telehealth (HOSPITAL_COMMUNITY): Payer: Self-pay | Admitting: Emergency Medicine

## 2021-07-20 MED ORDER — SULFACETAMIDE SODIUM 10 % OP SOLN: 1.0000 [drp] | Freq: Four times a day (QID) | OPHTHALMIC | 0 refills | Status: AC

## 2021-07-20 NOTE — Telephone Encounter (Signed)
Patient called requesting prescription be sent to [facility] in [place] as the [facility] did not have this medication available.  It was completed. ?

## 2022-02-16 ENCOUNTER — Emergency Department (HOSPITAL_COMMUNITY)
Admission: EM | Admit: 2022-02-16 | Discharge: 2022-02-16 | Discharge status: Against Medical Advice | Payer: Medicaid Other

## 2022-02-16 NOTE — ED Notes (Signed)
Pt notified registration she is leaving

## 2022-05-28 ENCOUNTER — Encounter (HOSPITAL_COMMUNITY): Payer: Self-pay | Admitting: *Deleted

## 2022-05-28 ENCOUNTER — Emergency Department (HOSPITAL_COMMUNITY): Payer: Medicaid Other

## 2022-05-28 ENCOUNTER — Emergency Department (HOSPITAL_COMMUNITY)
Admission: EM | Admit: 2022-05-28 | Discharge: 2022-05-28 | Disposition: A | Discharge status: Home / Self Care | Payer: Medicaid Other | Attending: Emergency Medicine | Admitting: Emergency Medicine

## 2022-05-28 ENCOUNTER — Other Ambulatory Visit: Payer: Self-pay

## 2022-05-28 DIAGNOSIS — R091 Pleurisy: Secondary | ICD-10-CM | POA: Insufficient documentation

## 2022-05-28 DIAGNOSIS — R079 Chest pain, unspecified: Secondary | ICD-10-CM | POA: Diagnosis present

## 2022-05-28 LAB — CBC
HCT: 36.4 % (ref 36.0–46.0)
Hemoglobin: 12.3 g/dL (ref 12.0–15.0)
MCH: 31.1 pg (ref 26.0–34.0)
MCHC: 33.8 g/dL (ref 30.0–36.0)
MCV: 91.9 fL (ref 80.0–100.0)
Platelets: 243 10*3/uL (ref 150–400)
RBC: 3.96 MIL/uL (ref 3.87–5.11)
RDW: 13.1 % (ref 11.5–15.5)
WBC: 7.7 10*3/uL (ref 4.0–10.5)
nRBC: 0 % (ref 0.0–0.2)

## 2022-05-28 LAB — BASIC METABOLIC PANEL
Anion gap: 10 (ref 5–15)
BUN: 11 mg/dL (ref 6–20)
CO2: 22 mmol/L (ref 22–32)
Calcium: 8.8 mg/dL — ABNORMAL LOW (ref 8.9–10.3)
Chloride: 103 mmol/L (ref 98–111)
Creatinine, Ser: 0.64 mg/dL (ref 0.44–1.00)
GFR, Estimated: >60 mL/min (ref >60)
Glucose, Bld: 89 mg/dL (ref 70–99)
Potassium: 3.6 mmol/L (ref 3.5–5.1)
Sodium: 135 mmol/L (ref 135–145)

## 2022-05-28 LAB — HEPATIC FUNCTION PANEL
ALT: 15 U/L (ref 0–44)
AST: 21 U/L (ref 15–41)
Albumin: 4.2 g/dL (ref 3.5–5.0)
Alkaline Phosphatase: 40 U/L (ref 38–126)
Bilirubin, Direct: 0.1 mg/dL (ref 0.0–0.2)
Indirect Bilirubin: 0.4 mg/dL (ref 0.3–0.9)
Total Bilirubin: 0.5 mg/dL (ref 0.3–1.2)
Total Protein: 7.1 g/dL (ref 6.5–8.1)

## 2022-05-28 LAB — HCG, QUANTITATIVE, PREGNANCY: hCG, Beta Chain, Quant, S: 3 m[IU]/mL (ref <5)

## 2022-05-28 LAB — TROPONIN I (HIGH SENSITIVITY)
Troponin I (High Sensitivity): 2 ng/L (ref <18)
Troponin I (High Sensitivity): 2 ng/L (ref <18)

## 2022-05-28 LAB — D-DIMER, QUANTITATIVE: D-Dimer, Quant: 0.37 ug/mL-FEU (ref 0.00–0.50)

## 2022-05-28 MED ORDER — OXYCODONE-ACETAMINOPHEN 5-325 MG PO TABS
1.0000 | ORAL_TABLET | Freq: Once | ORAL | Status: AC
  Administered 2022-05-28: 1 via ORAL
  Filled 2022-05-28: qty 1

## 2022-05-28 MED ORDER — IBUPROFEN 800 MG PO TABS: 800.0000 mg | ORAL_TABLET | Freq: Three times a day (TID) | ORAL | 0 refills | Status: AC | PRN

## 2022-05-28 MED ORDER — OXYCODONE-ACETAMINOPHEN 5-325 MG PO TABS
1.0000 | ORAL_TABLET | Freq: Four times a day (QID) | ORAL | 0 refills | Status: AC | PRN
Start: 2022-05-28 — End: ?

## 2022-05-28 MED ORDER — IOHEXOL 350 MG/ML SOLN
75.0000 mL | Freq: Once | INTRAVENOUS | Status: AC | PRN
  Administered 2022-05-28: 75 mL via INTRAVENOUS

## 2022-05-28 NOTE — ED Triage Notes (Signed)
Pt with left CP that woke pt up this morning, radiates to back.  + SOB

## 2022-05-28 NOTE — ED Provider Notes (Incomplete)
[facility] EMERGENCY DEPARTMENT Provider Note   CSN: [id number] Arrival date & time: 05/28/22  1426     History {Add pertinent medical, surgical, social history, OB history to HPI:1} Chief Complaint  Patient presents with   Chest Pain    Chloe Ortiz is a 47 y.o. female.  Patient complains of left chest pain.  Patient had a PE before and she was concerned about another Pe, patient's pain is pleuritic   Chest Pain      Home Medications Prior to Admission medications   Medication Sig Start Date End Date Taking? Authorizing Provider  acetaminophen (TYLENOL) 500 MG tablet Take 1,000 mg by mouth every 6 (six) hours as needed for moderate pain, mild pain, fever or headache.   Yes [provider]  albuterol (VENTOLIN HFA) 108 (90 Base) MCG/ACT inhaler Inhale 2 puffs into the lungs every 6 (six) hours as needed for wheezing or shortness of breath.   Yes [provider]  budesonide-formoterol (SYMBICORT) 80-4.5 MCG/ACT inhaler Inhale 2 puffs into the lungs daily as needed (wheezing, shortness of breath).   Yes [provider]  fluticasone (FLONASE) 50 MCG/ACT nasal spray Place 1-2 sprays into both nostrils daily as needed for allergies (runny nose).   Yes [provider]  ibuprofen (ADVIL) 800 MG tablet Take 1 tablet (800 mg total) by mouth every 8 (eight) hours as needed for moderate pain. 05/28/22  Yes Bethann Berkshire, MD  oxyCODONE-acetaminophen (PERCOCET/ROXICET) 5-325 MG tablet Take 1 tablet by mouth every 6 (six) hours as needed for severe pain. 05/28/22  Yes Bethann Berkshire, MD  Fluticasone-Umeclidin-Vilant (TRELEGY ELLIPTA) 200-62.5-25 MCG/INH AEPB Inhale 1 puff into the lungs daily. Patient not taking: Reported on 05/28/2022 07/23/19   Josephine Igo, DO      Allergies    Patient has no known allergies.    Review of Systems   Review of Systems  Cardiovascular:  Positive for chest pain.    Physical Exam Updated Vital Signs BP (!) 135/92    Pulse (!) 57   Temp 98.8 F (37.1 C) (Oral)   Resp (!) 21   Ht 5\' 6"  (1.676 m)   Wt 49.9 kg   SpO2 100%   BMI 17.75 kg/m  Physical Exam  ED Results / Procedures / Treatments   Labs (all labs ordered are listed, but only abnormal results are displayed) Labs Reviewed  BASIC METABOLIC PANEL - Abnormal; Notable for the following components:      Result Value   Calcium 8.8 (*)    All other components within normal limits  CBC  D-DIMER, QUANTITATIVE  HEPATIC FUNCTION PANEL  HCG, QUANTITATIVE, PREGNANCY  TROPONIN I (HIGH SENSITIVITY)  TROPONIN I (HIGH SENSITIVITY)    EKG EKG Interpretation  Date/Time:  Sunday May 28 2022 14:41:24 EST Ventricular Rate:  67 PR Interval:  148 QRS Duration: 72 QT Interval:  390 QTC Calculation: 412 R Axis:   -5 Text Interpretation: Normal sinus rhythm Low voltage QRS Cannot rule out Anterior infarct , age undetermined Abnormal ECG When compared with ECG of 25-Jun-2020 12:44, PREVIOUS ECG IS PRESENT Confirmed by 27-Jun-2020 [phone]) on 05/28/2022 6:16:56 PM  Radiology CT Angio Chest PE W and/or Wo Contrast  Result Date: 05/28/2022 CLINICAL DATA:  Left chest pain radiating into the back with shortness of breath. Pulmonary embolism suspected. EXAM: CT ANGIOGRAPHY CHEST WITH CONTRAST TECHNIQUE: Multidetector CT imaging of the chest was performed using the standard protocol during bolus administration of intravenous contrast. Multiplanar CT image reconstructions and  MIPs were obtained to evaluate the vascular anatomy. RADIATION DOSE REDUCTION: This exam was performed according to the departmental dose-optimization program which includes automated exposure control, adjustment of the mA and/or kV according to patient size and/or use of iterative reconstruction technique. CONTRAST:  47mL OMNIPAQUE IOHEXOL 350 MG/ML SOLN COMPARISON:  Chest radiograph same date.  Chest CTA 03/14/2020. FINDINGS: Cardiovascular: The pulmonary arteries are well opacified with  contrast to the level of the subsegmental branches. There is no evidence of acute pulmonary embolism. No acute systemic arterial abnormalities are identified on limited opacification of those vessels. The heart size is normal. There is no pericardial effusion. Mediastinum/Nodes: There are no enlarged mediastinal, hilar or axillary lymph nodes. The thyroid gland, trachea and esophagus demonstrate no significant findings. Lungs/Pleura: No pleural effusion or pneumothorax. Mild centrilobular and paraseptal emphysema with mild dependent atelectasis in both lower lobes. No airspace disease or suspicious pulmonary nodularity. Upper abdomen:  Mild reflux of contrast into the IVC again noted. Musculoskeletal/Chest wall: There is no chest wall mass or suspicious osseous finding. Mild thoracic scoliosis. Review of the MIP images confirms the above findings. IMPRESSION: 1. No evidence of acute pulmonary embolism or other acute chest findings. 2.  Emphysema (ICD10-J43.9). Electronically Signed   By: Richardean Sale M.D.   On: 05/28/2022 17:35   DG Chest 2 View  Result Date: 05/28/2022 CLINICAL DATA:  Chest pain.  Shortness of breath. EXAM: CHEST - 2 VIEW COMPARISON:  June 25, 2020 FINDINGS: No pneumothorax. The heart, hila, mediastinum, lungs, and pleura are unremarkable. IMPRESSION: No active cardiopulmonary disease. Electronically Signed   By: Dorise Bullion III M.D.   On: 05/28/2022 15:18    Procedures Procedures  {Document cardiac monitor, telemetry assessment procedure when appropriate:1}  Medications Ordered in ED Medications  oxyCODONE-acetaminophen (PERCOCET/ROXICET) 5-325 MG per tablet 1 tablet (has no administration in time range)  oxyCODONE-acetaminophen (PERCOCET/ROXICET) 5-325 MG per tablet 1 tablet (1 tablet Oral Given 05/28/22 1658)  iohexol (OMNIPAQUE) 350 MG/ML injection 75 mL (75 mLs Intravenous Contrast Given 05/28/22 1718)    ED Course/ Medical Decision Making/ A&P                            Medical Decision Making Amount and/or Complexity of Data Reviewed Labs: ordered. Radiology: ordered.  Risk Prescription drug management.   Patient with pleuritis.  She will be discharged home with Motrin and Percocet and follow-up with PCP  {Document critical care time when appropriate:1} {Document review of labs and clinical decision tools ie heart score, Chads2Vasc2 etc:1}  {Document your independent review of radiology images, and any outside records:1} {Document your discussion with family members, caretakers, and with consultants:1} {Document social determinants of health affecting pt's care:1} {Document your decision making why or why not admission, treatments were needed:1} Final Clinical Impression(s) / ED Diagnoses Final diagnoses:  Pleuritis    Rx / DC Orders ED Discharge Orders          Ordered    ibuprofen (ADVIL) 800 MG tablet  Every 8 hours PRN        05/28/22 1818    oxyCODONE-acetaminophen (PERCOCET/ROXICET) 5-325 MG tablet  Every 6 hours PRN        05/28/22 1818

## 2022-05-28 NOTE — Discharge Instructions (Signed)
Follow-up with your only doctor in a week if not improving

## 2022-05-31 ENCOUNTER — Encounter (INDEPENDENT_AMBULATORY_CARE_PROVIDER_SITE_OTHER): Payer: Self-pay

## 2022-06-22 ENCOUNTER — Institutional Professional Consult (permissible substitution): Payer: Medicaid Other | Admitting: Pulmonary Disease

## 2022-06-22 NOTE — Progress Notes (Deleted)
Synopsis: Referred in *** for *** by [facility]  Subjective:   PATIENT ID: Chloe Ortiz GENDER: female DOB: [birth year], MRN: [id number]  No chief complaint on file.   HPI  ***  Past Medical History:  Diagnosis Date   Allergy    Anemia    Anxiety    Blood transfusion without reported diagnosis    COPD (chronic obstructive pulmonary disease) ([place])    Depression    Pulmonary embolism ([place])      Family History  Problem Relation Age of Onset   Cancer Mother    Alcohol abuse Mother    Arthritis Mother    Diabetes Mother    Drug abuse Mother    Hypertension Mother    Miscarriages / [place] Mother    Varicose Veins Mother    Alcohol abuse Father    Asthma Father    Depression Father    Diabetes Father    Drug abuse Father    Hyperlipidemia Father    Hypertension Father    Mental illness Father    Varicose Veins Father    Diabetes Sister    Hypertension Sister    Varicose Veins Sister      Past Surgical History:  Procedure Laterality Date   ABDOMINAL HYSTERECTOMY     HERNIA REPAIR     Lypoma removal     TUBAL LIGATION      Social History   Socioeconomic History   Marital status: Divorced    Spouse name: Not on file   Number of children: Not on file   Years of education: Not on file   Highest education level: Not on file  Occupational History   Not on file  Tobacco Use   Smoking status: Every Day    Packs/day: 0.25    Years: 10.00    Total pack years: 2.50    Types: Cigars, Cigarettes   Smokeless tobacco: Never  Vaping Use   Vaping Use: Never used  Substance and Sexual Activity   Alcohol use: No   Drug use: Yes    Frequency: 7.0 times per week    Types: Marijuana    Comment: last used today   Sexual activity: Never  Other Topics Concern   Not on file  Social History Narrative   Not on file   Social Determinants of Health   Financial Resource Strain: Not on file  Food Insecurity: Not on file  Transportation Needs:  Not on file  Physical Activity: Not on file  Stress: Not on file  Social Connections: Not on file  Intimate Partner Violence: Not on file     No Known Allergies   Outpatient Medications Prior to Visit  Medication Sig Dispense Refill   acetaminophen (TYLENOL) 500 MG tablet Take 1,000 mg by mouth every 6 (six) hours as needed for moderate pain, mild pain, fever or headache.     albuterol (VENTOLIN HFA) 108 (90 Base) MCG/ACT inhaler Inhale 2 puffs into the lungs every 6 (six) hours as needed for wheezing or shortness of breath.     budesonide-formoterol (SYMBICORT) 80-4.5 MCG/ACT inhaler Inhale 2 puffs into the lungs daily as needed (wheezing, shortness of breath).     fluticasone (FLONASE) 50 MCG/ACT nasal spray Place 1-2 sprays into both nostrils daily as needed for allergies (runny nose).     Fluticasone-Umeclidin-Vilant (TRELEGY ELLIPTA) 200-62.5-25 MCG/INH AEPB Inhale 1 puff into the lungs daily. (Patient not taking: Reported on 05/28/2022) 14 each 0   ibuprofen (ADVIL) 800 MG  tablet Take 1 tablet (800 mg total) by mouth every 8 (eight) hours as needed for moderate pain. 21 tablet 0   oxyCODONE-acetaminophen (PERCOCET/ROXICET) 5-325 MG tablet Take 1 tablet by mouth every 6 (six) hours as needed for severe pain. 10 tablet 0   No facility-administered medications prior to visit.    ROS   Objective:  Physical Exam   There were no vitals filed for this visit.   on *** LPM *** RA BMI Readings from Last 3 Encounters:  05/28/22 17.75 kg/m  06/25/20 19.30 kg/m  06/11/20 19.53 kg/m   Wt Readings from Last 3 Encounters:  05/28/22 110 lb (49.9 kg)  06/25/20 116 lb (52.6 kg)  06/11/20 121 lb (54.9 kg)     CBC    Component Value Date/Time   WBC 7.7 05/28/2022 1538   RBC 3.96 05/28/2022 1538   HGB 12.3 05/28/2022 1538   HCT 36.4 05/28/2022 1538   PLT 243 05/28/2022 1538   MCV 91.9 05/28/2022 1538   MCH 31.1 05/28/2022 1538   MCHC 33.8 05/28/2022 1538   RDW 13.1 05/28/2022  1538   LYMPHSABS 1.9 03/14/2020 1258   MONOABS 0.5 03/14/2020 1258   EOSABS 0.2 03/14/2020 1258   BASOSABS 0.0 03/14/2020 1258    ***  Chest Imaging: ***  Pulmonary Functions Testing Results:     No data to display          FeNO: ***  Pathology: ***  Echocardiogram: ***  Heart Catheterization: ***    Assessment & Plan:   No diagnosis found.  Discussion: ***   Current Outpatient Medications:    acetaminophen (TYLENOL) 500 MG tablet, Take 1,000 mg by mouth every 6 (six) hours as needed for moderate pain, mild pain, fever or headache., Disp: , Rfl:    albuterol (VENTOLIN HFA) 108 (90 Base) MCG/ACT inhaler, Inhale 2 puffs into the lungs every 6 (six) hours as needed for wheezing or shortness of breath., Disp: , Rfl:    budesonide-formoterol (SYMBICORT) 80-4.5 MCG/ACT inhaler, Inhale 2 puffs into the lungs daily as needed (wheezing, shortness of breath)., Disp: , Rfl:    fluticasone (FLONASE) 50 MCG/ACT nasal spray, Place 1-2 sprays into both nostrils daily as needed for allergies (runny nose)., Disp: , Rfl:    Fluticasone-Umeclidin-Vilant (TRELEGY ELLIPTA) 200-62.5-25 MCG/INH AEPB, Inhale 1 puff into the lungs daily. (Patient not taking: Reported on 05/28/2022), Disp: 14 each, Rfl: 0   ibuprofen (ADVIL) 800 MG tablet, Take 1 tablet (800 mg total) by mouth every 8 (eight) hours as needed for moderate pain., Disp: 21 tablet, Rfl: 0   oxyCODONE-acetaminophen (PERCOCET/ROXICET) 5-325 MG tablet, Take 1 tablet by mouth every 6 (six) hours as needed for severe pain., Disp: 10 tablet, Rfl: 0  I spent *** minutes dedicated to the care of this patient on the date of this encounter to include pre-visit review of records, face-to-face time with the patient discussing conditions above, post visit ordering of testing, clinical documentation with the electronic health record, making appropriate referrals as documented, and communicating necessary findings to members of the patients care team.    Garner Nash, DO [facility] 06/22/2022 11:09 AM

## 2023-04-28 DIAGNOSIS — R1031 Right lower quadrant pain: Secondary | ICD-10-CM | POA: Diagnosis not present

## 2023-04-28 DIAGNOSIS — R109 Unspecified abdominal pain: Secondary | ICD-10-CM | POA: Diagnosis not present

## 2023-04-28 DIAGNOSIS — R0789 Other chest pain: Secondary | ICD-10-CM | POA: Diagnosis not present

## 2023-04-28 DIAGNOSIS — R091 Pleurisy: Secondary | ICD-10-CM | POA: Diagnosis not present

## 2023-06-11 DIAGNOSIS — R0602 Shortness of breath: Secondary | ICD-10-CM | POA: Diagnosis not present

## 2023-06-11 DIAGNOSIS — F410 Panic disorder [episodic paroxysmal anxiety] without agoraphobia: Secondary | ICD-10-CM | POA: Diagnosis not present

## 2023-06-11 DIAGNOSIS — R0789 Other chest pain: Secondary | ICD-10-CM | POA: Diagnosis not present

## 2023-06-14 ENCOUNTER — Telehealth: Payer: Self-pay

## 2023-06-14 NOTE — Telephone Encounter (Deleted)
Copied from CRM [id number]. Topic: Referral - Request for Referral >> Jun 14, 2023  4:25 PM Shelah Lewandowsky wrote: Did the patient discuss referral with their provider in the last year? No (If No - schedule appointment) (If Yes - send message)  Appointment offered? Yes has an appointment already  Type of order/referral and detailed reason for visit: behavioral health  Preference of office, provider, location: [facility]  If referral order, have you been seen by this specialty before? Yes (If Yes, this issue or another issue? When? Where?  Can we respond through MyChart? Yes

## 2023-06-15 ENCOUNTER — Telehealth: Payer: Self-pay

## 2023-06-15 NOTE — Telephone Encounter (Signed)
Chloe Ortiz

## 2023-06-15 NOTE — Telephone Encounter (Signed)
Copied from CRM [id number]. Topic: Referral - Question >> Jun 15, 2023  9:56 AM Donita Brooks wrote: Reason for CRM: pt called in yesterday regarding a referral for behavioral health with [facility]. pt wanted to send over correct fax number due to [employer] having incorrect fax number.  fax number - [phone]

## 2023-07-26 ENCOUNTER — Other Ambulatory Visit: Payer: Self-pay | Admitting: Family Medicine

## 2023-07-26 ENCOUNTER — Ambulatory Visit: Payer: Self-pay | Admitting: Family Medicine

## 2023-12-28 DIAGNOSIS — R053 Chronic cough: Secondary | ICD-10-CM | POA: Diagnosis not present

## 2023-12-28 DIAGNOSIS — R634 Abnormal weight loss: Secondary | ICD-10-CM | POA: Diagnosis not present

## 2023-12-28 DIAGNOSIS — R030 Elevated blood-pressure reading, without diagnosis of hypertension: Secondary | ICD-10-CM | POA: Diagnosis not present

## 2023-12-28 DIAGNOSIS — R197 Diarrhea, unspecified: Secondary | ICD-10-CM | POA: Diagnosis not present

## 2023-12-28 DIAGNOSIS — Z681 Body mass index (BMI) 19 or less, adult: Secondary | ICD-10-CM | POA: Diagnosis not present

## 2023-12-28 DIAGNOSIS — F17200 Nicotine dependence, unspecified, uncomplicated: Secondary | ICD-10-CM | POA: Diagnosis not present

## 2023-12-28 DIAGNOSIS — R1110 Vomiting, unspecified: Secondary | ICD-10-CM | POA: Diagnosis not present

## 2023-12-30 DIAGNOSIS — R112 Nausea with vomiting, unspecified: Secondary | ICD-10-CM | POA: Diagnosis not present

## 2023-12-30 DIAGNOSIS — J45909 Unspecified asthma, uncomplicated: Secondary | ICD-10-CM | POA: Diagnosis not present

## 2023-12-30 DIAGNOSIS — F419 Anxiety disorder, unspecified: Secondary | ICD-10-CM | POA: Diagnosis not present

## 2023-12-30 DIAGNOSIS — E0590 Thyrotoxicosis, unspecified without thyrotoxic crisis or storm: Secondary | ICD-10-CM | POA: Diagnosis not present

## 2023-12-30 DIAGNOSIS — I10 Essential (primary) hypertension: Secondary | ICD-10-CM | POA: Diagnosis not present

## 2023-12-30 DIAGNOSIS — Z87891 Personal history of nicotine dependence: Secondary | ICD-10-CM | POA: Diagnosis not present

## 2023-12-30 LAB — TSH
TSH: 0.01 — AB (ref 0.41–5.90)
TSH: 0.01 — AB (ref 0.41–5.90)

## 2024-01-01 DIAGNOSIS — R1310 Dysphagia, unspecified: Secondary | ICD-10-CM | POA: Diagnosis not present

## 2024-01-01 DIAGNOSIS — R5383 Other fatigue: Secondary | ICD-10-CM | POA: Diagnosis not present

## 2024-01-01 DIAGNOSIS — Z1211 Encounter for screening for malignant neoplasm of colon: Secondary | ICD-10-CM | POA: Diagnosis not present

## 2024-01-01 DIAGNOSIS — R7989 Other specified abnormal findings of blood chemistry: Secondary | ICD-10-CM | POA: Diagnosis not present

## 2024-01-01 DIAGNOSIS — R634 Abnormal weight loss: Secondary | ICD-10-CM | POA: Diagnosis not present

## 2024-01-01 DIAGNOSIS — Z131 Encounter for screening for diabetes mellitus: Secondary | ICD-10-CM | POA: Diagnosis not present

## 2024-01-01 DIAGNOSIS — Z13220 Encounter for screening for lipoid disorders: Secondary | ICD-10-CM | POA: Diagnosis not present

## 2024-01-01 DIAGNOSIS — E069 Thyroiditis, unspecified: Secondary | ICD-10-CM | POA: Diagnosis not present

## 2024-01-02 DIAGNOSIS — Z13220 Encounter for screening for lipoid disorders: Secondary | ICD-10-CM | POA: Diagnosis not present

## 2024-01-02 DIAGNOSIS — E0590 Thyrotoxicosis, unspecified without thyrotoxic crisis or storm: Secondary | ICD-10-CM | POA: Diagnosis not present

## 2024-01-02 DIAGNOSIS — R634 Abnormal weight loss: Secondary | ICD-10-CM | POA: Diagnosis not present

## 2024-01-02 DIAGNOSIS — Z131 Encounter for screening for diabetes mellitus: Secondary | ICD-10-CM | POA: Diagnosis not present

## 2024-01-02 DIAGNOSIS — Z681 Body mass index (BMI) 19 or less, adult: Secondary | ICD-10-CM | POA: Diagnosis not present

## 2024-01-02 DIAGNOSIS — R5383 Other fatigue: Secondary | ICD-10-CM | POA: Diagnosis not present

## 2024-01-08 ENCOUNTER — Encounter: Payer: Self-pay | Admitting: "Endocrinology

## 2024-01-08 ENCOUNTER — Ambulatory Visit (INDEPENDENT_AMBULATORY_CARE_PROVIDER_SITE_OTHER): Admitting: "Endocrinology

## 2024-01-08 VITALS — BP 122/78 | HR 68 | Ht 65.0 in | Wt 98.8 lb

## 2024-01-08 DIAGNOSIS — F17200 Nicotine dependence, unspecified, uncomplicated: Secondary | ICD-10-CM | POA: Diagnosis not present

## 2024-01-08 DIAGNOSIS — E0590 Thyrotoxicosis, unspecified without thyrotoxic crisis or storm: Secondary | ICD-10-CM | POA: Diagnosis not present

## 2024-01-08 MED ORDER — METHIMAZOLE 5 MG PO TABS: 5.0000 mg | ORAL_TABLET | Freq: Every day | ORAL | 0 refills | Status: DC

## 2024-01-08 MED ORDER — PREDNISONE 10 MG PO TABS: 10.0000 mg | ORAL_TABLET | Freq: Every day | ORAL | 0 refills | Status: AC

## 2024-01-08 NOTE — Progress Notes (Signed)
 Endocrinology Consult Note                                            01/08/2024, 12:53 PM   Subjective:    Patient ID: Chloe Ortiz, female    DOB: [birth year], PCP Drew Domino, NP   Past Medical History:  Diagnosis Date   Allergy    Anemia    Anxiety    Blood transfusion without reported diagnosis    COPD (chronic obstructive pulmonary disease) (HCC)    Depression    Pulmonary embolism (HCC)    Past Surgical History:  Procedure Laterality Date   ABDOMINAL HYSTERECTOMY     HERNIA REPAIR     Lypoma removal     TUBAL LIGATION     Social History   Socioeconomic History   Marital status: Divorced    Spouse name: Not on file   Number of children: Not on file   Years of education: Not on file   Highest education level: Not on file  Occupational History   Not on file  Tobacco Use   Smoking status: Every Day    Current packs/day: 0.25    Average packs/day: 0.3 packs/day for 10.0 years (2.5 ttl pk-yrs)    Types: Cigars, Cigarettes   Smokeless tobacco: Never  Vaping Use   Vaping status: Never Used  Substance and Sexual Activity   Alcohol use: No   Drug use: Yes    Frequency: 7.0 times per week    Types: Marijuana    Comment: last used 1-2 weeks ago   Sexual activity: Never  Other Topics Concern   Not on file  Social History Narrative   Not on file   Social Drivers of Health   Financial Resource Strain: Not on file  Food Insecurity: No Food Insecurity (01/01/2024)   Received from [facility]   Hunger Vital Sign    Within the past 12 months, you worried that your food would run out before you got the money to buy more.: Never true    Within the past 12 months, the food you bought just didn't last and you didn't have money to get more.: Never true  Transportation Needs: No Transportation Needs (01/01/2024)   Received from [facility]   PRAPARE - Transportation    Lack of Transportation (Medical): No    Lack of Transportation  (Non-Medical): No  Physical Activity: Not on file  Stress: Not on file  Social Connections: Not on file   Family History  Problem Relation Age of Onset   Cancer Mother    Alcohol abuse Mother    Arthritis Mother    Diabetes Mother    Drug abuse Mother    Hypertension Mother    Miscarriages / [place] Mother    Varicose Veins Mother    Alcohol abuse Father    Asthma Father    Depression Father    Diabetes Father    Drug abuse Father    Hyperlipidemia Father    Hypertension Father    Mental illness Father    Varicose Veins Father    Diabetes Sister    Hypertension Sister    Varicose Veins Sister    Outpatient Encounter Medications as of 01/08/2024  Medication Sig   methimazole  (TAPAZOLE ) 5 MG tablet Take 1 tablet (5 mg total) by mouth daily with  breakfast.   predniSONE  (DELTASONE ) 10 MG tablet Take 1 tablet (10 mg total) by mouth daily with breakfast.   propranolol ER (INDERAL LA) 60 MG 24 hr capsule Take 60 mg by mouth daily.   acetaminophen  (TYLENOL ) 500 MG tablet Take 1,000 mg by mouth every 6 (six) hours as needed for moderate pain, mild pain, fever or headache.   albuterol  (VENTOLIN  HFA) 108 (90 Base) MCG/ACT inhaler Inhale 2 puffs into the lungs every 6 (six) hours as needed for wheezing or shortness of breath. (Patient not taking: Reported on 01/08/2024)   budesonide-formoterol (SYMBICORT) 80-4.5 MCG/ACT inhaler Inhale 2 puffs into the lungs daily as needed (wheezing, shortness of breath). (Patient not taking: Reported on 01/08/2024)   fluticasone (FLONASE) 50 MCG/ACT nasal spray Place 1-2 sprays into both nostrils daily as needed for allergies (runny nose). (Patient not taking: Reported on 01/08/2024)   Fluticasone-Umeclidin-Vilant (TRELEGY ELLIPTA ) 200-62.5-25 MCG/INH AEPB Inhale 1 puff into the lungs daily. (Patient not taking: Reported on 05/28/2022)   ibuprofen  (ADVIL ) 800 MG tablet Take 1 tablet (800 mg total) by mouth every 8 (eight) hours as needed for moderate pain.    oxyCODONE -acetaminophen  (PERCOCET/ROXICET) 5-325 MG tablet Take 1 tablet by mouth every 6 (six) hours as needed for severe pain.   No facility-administered encounter medications on file as of 01/08/2024.   ALLERGIES: No Known Allergies  VACCINATION STATUS:  There is no immunization history on file for this patient.  HPI Chloe Ortiz is 48 y.o. female who presents today with a medical history as above. she is being seen in consultation for hyperthyroidism requested by Drew Domino, NP.  History is obtained directly from the patient as well as chart review.  Earlier this month, she presented to her primary care provider with complaints of weight loss, palpitations, tremors, anxiety.  She was offered thyroid function test which showed evidence of hyperthyroidism.  She was put on propranolol 60 mg ER p.o. daily which helped a little bit with the palpitations.  She continues to lose weight, overall lost 40 pounds.  She continues to have anxiety and irritability.  She has sleep disturbance. She denies family history of thyroid dysfunction. She denies dysphagia, shortness of breath, or voice change Her other medical problems include allergies, anemia, depression, COPD, history of pulmonary embolism.  She continues to smoke cigarettes and marijuana.  She would like to have a full assessment of her thyroid and treatment. She denies any exposure or ingestion of thyroid supplements.  Review of Systems  Constitutional: + 40 pounds of weight loss in less than 6 months, + fatigue, +subjective hyperthermia Eyes: no blurry vision, no xerophthalmia ENT: no sore throat, no nodules palpated in throat, no dysphagia/odynophagia, no hoarseness Cardiovascular: no Chest Pain, no Shortness of Breath, + palpitations, no leg swelling Respiratory: no cough, no shortness of breath Gastrointestinal: no Nausea/Vomiting/Diarhhea Musculoskeletal: no muscle/joint aches Skin: no rashes Neurological: + tremors,  no numbness, no tingling, no dizziness Psychiatric: no depression, + anxiety; irritability  Objective:       01/08/2024   10:23 AM 05/28/2022    6:34 PM 05/28/2022    5:30 PM  Vitals with BMI  Height 5' 5    Weight 98 lbs 13 oz    BMI 16.44    Systolic 122 122 864  Diastolic 78 91 92  Pulse 68 57 57    BP 122/78   Pulse 68   Ht 5' 5 (1.651 m)   Wt 98 lb 12.8 oz (44.8 kg)   BMI  16.44 kg/m   Wt Readings from Last 3 Encounters:  01/08/24 98 lb 12.8 oz (44.8 kg)  05/28/22 110 lb (49.9 kg)  06/25/20 116 lb (52.6 kg)    Physical Exam  Constitutional:  Body mass index is 16.44 kg/m.,  not in acute distress, normal state of mind Eyes: PERRLA, EOMI, no exophthalmos ENT: moist mucous membranes, + gross thyromegaly with bruit, no gross cervical lymphadenopathy Cardiovascular: normal precordial activity, Regular Rate and Rhythm, no Murmur/Rubs/Gallops Respiratory:  adequate breathing efforts, no gross chest deformity, Clear to auscultation bilaterally Gastrointestinal: abdomen soft, Non -tender, No distension, Bowel Sounds present, no gross organomegaly Musculoskeletal: no gross deformities, strength intact in all four extremities, no peripheral edema Skin: moist, warm, no rashes Neurological: + tremor with outstretched hands, Deep tendon reflexes normal in bilateral lower extremities.  CMP ( most recent) CMP     Component Value Date/Time   NA 135 05/28/2022 1538   K 3.6 05/28/2022 1538   CL 103 05/28/2022 1538   CO2 22 05/28/2022 1538   GLUCOSE 89 05/28/2022 1538   BUN 11 05/28/2022 1538   CREATININE 0.64 05/28/2022 1538   CALCIUM 8.8 (L) 05/28/2022 1538   PROT 7.1 05/28/2022 1538   ALBUMIN 4.2 05/28/2022 1538   AST 21 05/28/2022 1538   ALT 15 05/28/2022 1538   ALKPHOS 40 05/28/2022 1538   BILITOT 0.5 05/28/2022 1538   GFRNONAA >60 05/28/2022 1538   December 30, 2023 labs TSH <0.007, free T4 of 1.61, free T3 > 20 January 02, 2024 TSH <0.007, free T44.47, free T3 >  20  Assessment & Plan:   1. Hyperthyroidism (Primary)  - Chloe Ortiz  is being seen at a kind request of Drew Domino, NP. - I have reviewed her available thyroid records and clinically evaluated the patient. - Based on these reviews, she has thyrotoxicosis.  She will likely need confirmatory tests to diagnose etiology.  However, in light of her severe thyroid hormone burden putting her at risk for thyroid storm, antithyroid intervention would be initiated immediately. I discussed initiated methimazole  5 mg p.o. daily at breakfast.  I also ordered prednisone  10 mg p.o. daily for the next 15 days.  She is advised to continue propranolol 60 mg p.o. daily.  She will have previsit labs including antithyroid antibodies and 7 weeks. After her thyroid is reasonably controlled, she will be considered for treatment was drawn and performed thyroid uptake and scan in preparation for radioactive iodine thyroid ablation as a definitive treatment.  I discussed the complications of untreated thyrotoxicosis including thyroid storm. The patient was counseled on the dangers of tobacco use, and was advised to quit.  Reviewed strategies to maximize success, including removing cigarettes and smoking materials from environment.  - she is advised to maintain close follow up with Drew Domino, NP for primary care needs.   -Thank you for involving me in the care of this pleasant patient.  Time spent with the patient: 45  minutes spent in  counseling her about thyrotoxicosis and the rest in obtaining information about her symptoms, reviewing her previous labs/studies (including abstractions from other facilities),  evaluations, and treatments,  and developing a plan to confirm diagnosis and long term treatment based on the latest standards of care/guidelines; and documenting her care.  Chloe Ortiz participated in the discussions, expressed understanding, and voiced agreement with the above plans.  All  questions were answered to her satisfaction. she is encouraged to contact clinic should she have any questions or concerns prior  to her return visit.  Follow up plan: Return in about 8 weeks (around 03/04/2024) for F/U with Pre-visit Labs.   Ranny Earl, MD [facility] [address] [place], [place] [address] Phone: [phone]  Fax: [phone]     01/08/2024, 12:53 PM  This note was partially dictated with voice recognition software. Similar sounding words can be transcribed inadequately or may not  be corrected upon review.

## 2024-01-31 DIAGNOSIS — E069 Thyroiditis, unspecified: Secondary | ICD-10-CM | POA: Diagnosis not present

## 2024-01-31 DIAGNOSIS — R634 Abnormal weight loss: Secondary | ICD-10-CM | POA: Diagnosis not present

## 2024-01-31 DIAGNOSIS — R1310 Dysphagia, unspecified: Secondary | ICD-10-CM | POA: Diagnosis not present

## 2024-02-15 DIAGNOSIS — Z5181 Encounter for therapeutic drug level monitoring: Secondary | ICD-10-CM | POA: Diagnosis not present

## 2024-02-18 DIAGNOSIS — F5105 Insomnia due to other mental disorder: Secondary | ICD-10-CM | POA: Diagnosis not present

## 2024-02-18 DIAGNOSIS — F411 Generalized anxiety disorder: Secondary | ICD-10-CM | POA: Diagnosis not present

## 2024-03-06 ENCOUNTER — Ambulatory Visit: Admitting: "Endocrinology

## 2024-03-14 DIAGNOSIS — E069 Thyroiditis, unspecified: Secondary | ICD-10-CM | POA: Diagnosis not present

## 2024-03-17 DIAGNOSIS — F5105 Insomnia due to other mental disorder: Secondary | ICD-10-CM | POA: Diagnosis not present

## 2024-03-17 DIAGNOSIS — F411 Generalized anxiety disorder: Secondary | ICD-10-CM | POA: Diagnosis not present

## 2024-03-26 DIAGNOSIS — E0590 Thyrotoxicosis, unspecified without thyrotoxic crisis or storm: Secondary | ICD-10-CM | POA: Diagnosis not present

## 2024-03-28 LAB — THYROGLOBULIN ANTIBODY: Thyroglobulin Antibody: 7.5 [IU]/mL — AB (ref 0.0–0.9)

## 2024-03-28 LAB — THYROID STIMULATING IMMUNOGLOBULIN: Thyroid Stim Immunoglobulin: 12.7 IU/L — AB (ref 0.00–0.55)

## 2024-03-28 LAB — THYROID PEROXIDASE ANTIBODY: Thyroperoxidase Ab SerPl-aCnc: 125 [IU]/mL — ABNORMAL HIGH (ref 0–34)

## 2024-03-28 LAB — TSH: TSH: <0.005 u[IU]/mL — ABNORMAL LOW (ref 0.450–4.500)

## 2024-03-28 LAB — T4, FREE: Free T4: 2.97 ng/dL — ABNORMAL HIGH (ref 0.82–1.77)

## 2024-03-28 LAB — T3: T3, Total: 356 ng/dL — ABNORMAL HIGH (ref 71–180)

## 2024-04-11 ENCOUNTER — Ambulatory Visit: Admitting: "Endocrinology

## 2024-04-14 ENCOUNTER — Ambulatory Visit: Admitting: "Endocrinology

## 2024-04-15 ENCOUNTER — Other Ambulatory Visit: Payer: Self-pay | Admitting: "Endocrinology

## 2024-06-17 ENCOUNTER — Other Ambulatory Visit: Payer: Self-pay | Admitting: "Endocrinology

## 2024-06-17 ENCOUNTER — Telehealth: Payer: Self-pay | Admitting: "Endocrinology

## 2024-06-17 DIAGNOSIS — E0590 Thyrotoxicosis, unspecified without thyrotoxic crisis or storm: Secondary | ICD-10-CM

## 2024-06-17 NOTE — Telephone Encounter (Signed)
-----   Message from Ethelle Earl, MD sent at 06/17/2024  2:42 PM EST ----- Brittany, I was wondering if this patient has a follow up appointment? Looks like she missed. I need her to return, with repeat labs. I will order.

## 2024-06-17 NOTE — Telephone Encounter (Signed)
 Tried to call pt, NO VM set up

## 2024-07-03 ENCOUNTER — Ambulatory Visit: Admitting: "Endocrinology
